# Patient Record
Sex: Female | Born: 1970 | Race: Black or African American | Hispanic: No | Marital: Single | Smoking: Never smoker
Health system: Southern US, Community
[De-identification: ages and names within clinical notes are randomized; demographics above are authoritative.]

## PROBLEM LIST (undated history)

## (undated) DIAGNOSIS — G43909 Migraine, unspecified, not intractable, without status migrainosus: Secondary | ICD-10-CM

## (undated) HISTORY — DX: Migraine, unspecified, not intractable, without status migrainosus: G43.909

---

## 2008-08-10 HISTORY — PX: CHOLECYSTECTOMY: SHX55

## 2009-02-17 ENCOUNTER — Emergency Department (HOSPITAL_COMMUNITY): Admission: EM | Admit: 2009-02-17 | Discharge: 2009-02-17 | Payer: Self-pay | Admitting: Emergency Medicine

## 2009-12-23 ENCOUNTER — Emergency Department (HOSPITAL_COMMUNITY): Admission: EM | Admit: 2009-12-23 | Discharge: 2009-12-23 | Payer: Self-pay | Admitting: Emergency Medicine

## 2010-01-12 ENCOUNTER — Emergency Department (HOSPITAL_COMMUNITY): Admission: EM | Admit: 2010-01-12 | Discharge: 2010-01-12 | Payer: Self-pay | Admitting: Emergency Medicine

## 2010-08-06 ENCOUNTER — Ambulatory Visit (HOSPITAL_COMMUNITY)
Admission: RE | Admit: 2010-08-06 | Discharge: 2010-08-06 | Payer: Self-pay | Source: Home / Self Care | Attending: Surgery | Admitting: Surgery

## 2010-10-20 LAB — COMPREHENSIVE METABOLIC PANEL
BUN: 8 mg/dL (ref 6–23)
CO2: 25 mEq/L (ref 19–32)
Chloride: 105 mEq/L (ref 96–112)
Creatinine, Ser: 0.63 mg/dL (ref 0.4–1.2)
GFR calc non Af Amer: >60 mL/min (ref >60)
Glucose, Bld: 82 mg/dL (ref 70–99)
Total Bilirubin: 0.5 mg/dL (ref 0.3–1.2)

## 2010-10-20 LAB — SURGICAL PCR SCREEN: Staphylococcus aureus: POSITIVE — AB

## 2010-10-20 LAB — CBC
HCT: 35 % — ABNORMAL LOW (ref 36.0–46.0)
Hemoglobin: 11.8 g/dL — ABNORMAL LOW (ref 12.0–15.0)
MCH: 30 pg (ref 26.0–34.0)
MCV: 89.1 fL (ref 78.0–100.0)
RBC: 3.93 MIL/uL (ref 3.87–5.11)

## 2010-10-20 LAB — DIFFERENTIAL
Basophils Absolute: 0 10*3/uL (ref 0.0–0.1)
Basophils Relative: 1 % (ref 0–1)
Lymphocytes Relative: 49 % — ABNORMAL HIGH (ref 12–46)
Neutro Abs: 1.5 10*3/uL — ABNORMAL LOW (ref 1.7–7.7)

## 2010-10-27 LAB — COMPREHENSIVE METABOLIC PANEL
Albumin: 4 g/dL (ref 3.5–5.2)
Alkaline Phosphatase: 74 U/L (ref 39–117)
BUN: 11 mg/dL (ref 6–23)
Calcium: 9.1 mg/dL (ref 8.4–10.5)
Creatinine, Ser: 0.66 mg/dL (ref 0.4–1.2)
Glucose, Bld: 94 mg/dL (ref 70–99)
Potassium: 4.1 mEq/L (ref 3.5–5.1)
Total Protein: 7.6 g/dL (ref 6.0–8.3)

## 2010-10-27 LAB — DIFFERENTIAL
Lymphocytes Relative: 34 % (ref 12–46)
Lymphs Abs: 1.2 10*3/uL (ref 0.7–4.0)
Monocytes Absolute: 0.3 10*3/uL (ref 0.1–1.0)
Monocytes Relative: 8 % (ref 3–12)
Neutro Abs: 2.1 10*3/uL (ref 1.7–7.7)
Neutrophils Relative %: 56 % (ref 43–77)

## 2010-10-27 LAB — URINALYSIS, ROUTINE W REFLEX MICROSCOPIC
Bilirubin Urine: NEGATIVE
Bilirubin Urine: NEGATIVE
Hgb urine dipstick: NEGATIVE
Ketones, ur: NEGATIVE mg/dL
Nitrite: NEGATIVE
Nitrite: NEGATIVE
Specific Gravity, Urine: 1.024 (ref 1.005–1.030)
Urobilinogen, UA: 0.2 mg/dL (ref 0.0–1.0)
Urobilinogen, UA: 1 mg/dL (ref 0.0–1.0)
pH: 8 (ref 5.0–8.0)
pH: 7.5 (ref 5.0–8.0)

## 2010-10-27 LAB — CBC
HCT: 32.7 % — ABNORMAL LOW (ref 36.0–46.0)
Hemoglobin: 11.1 g/dL — ABNORMAL LOW (ref 12.0–15.0)
MCHC: 34 g/dL (ref 30.0–36.0)
Platelets: 230 10*3/uL (ref 150–400)
RDW: 12.1 % (ref 11.5–15.5)

## 2010-10-27 LAB — GC/CHLAMYDIA PROBE AMP, GENITAL
Chlamydia, DNA Probe: NEGATIVE
GC Probe Amp, Genital: NEGATIVE

## 2010-10-27 LAB — URINE CULTURE: Colony Count: NO GROWTH

## 2010-10-27 LAB — GC/CHLAMYDIA PROBE AMP, URINE
Chlamydia, Swab/Urine, PCR: NEGATIVE
GC Probe Amp, Urine: NEGATIVE

## 2010-10-27 LAB — POCT PREGNANCY, URINE: Preg Test, Ur: NEGATIVE

## 2010-10-27 LAB — WET PREP, GENITAL
Trich, Wet Prep: NONE SEEN
Yeast Wet Prep HPF POC: NONE SEEN

## 2010-11-16 LAB — CBC
HCT: 33.6 % — ABNORMAL LOW (ref 36.0–46.0)
Hemoglobin: 11.1 g/dL — ABNORMAL LOW (ref 12.0–15.0)
MCHC: 33.2 g/dL (ref 30.0–36.0)
MCV: 90.4 fL (ref 78.0–100.0)
Platelets: 236 10*3/uL (ref 150–400)
RBC: 3.71 MIL/uL — ABNORMAL LOW (ref 3.87–5.11)
RDW: 13 % (ref 11.5–15.5)
WBC: 5.7 10*3/uL (ref 4.0–10.5)

## 2010-11-16 LAB — URINALYSIS, ROUTINE W REFLEX MICROSCOPIC
Glucose, UA: NEGATIVE mg/dL
Hgb urine dipstick: NEGATIVE
Ketones, ur: NEGATIVE mg/dL
Protein, ur: NEGATIVE mg/dL
Urobilinogen, UA: 1 mg/dL (ref 0.0–1.0)

## 2010-11-16 LAB — DIFFERENTIAL
Basophils Absolute: 0 10*3/uL (ref 0.0–0.1)
Basophils Relative: 0 % (ref 0–1)
Eosinophils Absolute: 0 10*3/uL (ref 0.0–0.7)
Neutrophils Relative %: 72 % (ref 43–77)

## 2010-11-16 LAB — LIPASE, BLOOD: Lipase: 28 U/L (ref 11–59)

## 2010-11-16 LAB — COMPREHENSIVE METABOLIC PANEL
Albumin: 4.1 g/dL (ref 3.5–5.2)
BUN: 13 mg/dL (ref 6–23)
Calcium: 9.1 mg/dL (ref 8.4–10.5)
Glucose, Bld: 111 mg/dL — ABNORMAL HIGH (ref 70–99)
Total Protein: 7.3 g/dL (ref 6.0–8.3)

## 2010-11-16 LAB — POCT PREGNANCY, URINE: Preg Test, Ur: NEGATIVE

## 2011-03-15 ENCOUNTER — Emergency Department (HOSPITAL_COMMUNITY)
Admission: EM | Admit: 2011-03-15 | Discharge: 2011-03-16 | Disposition: A | Discharge status: Home / Self Care | Payer: Self-pay | Attending: Emergency Medicine | Admitting: Emergency Medicine

## 2011-03-15 DIAGNOSIS — G43909 Migraine, unspecified, not intractable, without status migrainosus: Secondary | ICD-10-CM | POA: Insufficient documentation

## 2013-02-13 ENCOUNTER — Other Ambulatory Visit: Payer: Self-pay | Admitting: Family Medicine

## 2013-02-13 ENCOUNTER — Other Ambulatory Visit (HOSPITAL_COMMUNITY)
Admission: RE | Admit: 2013-02-13 | Discharge: 2013-02-13 | Disposition: A | Discharge status: Home / Self Care | Payer: BC Managed Care – PPO | Source: Ambulatory Visit | Attending: Family Medicine | Admitting: Family Medicine

## 2013-02-13 DIAGNOSIS — N6315 Unspecified lump in the right breast, overlapping quadrants: Secondary | ICD-10-CM

## 2013-02-13 DIAGNOSIS — Z124 Encounter for screening for malignant neoplasm of cervix: Secondary | ICD-10-CM | POA: Insufficient documentation

## 2013-02-13 DIAGNOSIS — Z1151 Encounter for screening for human papillomavirus (HPV): Secondary | ICD-10-CM | POA: Insufficient documentation

## 2013-02-23 ENCOUNTER — Ambulatory Visit
Admission: RE | Admit: 2013-02-23 | Discharge: 2013-02-23 | Disposition: A | Discharge status: Home / Self Care | Payer: BC Managed Care – PPO | Source: Ambulatory Visit | Attending: Family Medicine | Admitting: Family Medicine

## 2013-02-23 ENCOUNTER — Other Ambulatory Visit: Payer: Self-pay | Admitting: Family Medicine

## 2013-02-23 DIAGNOSIS — N6315 Unspecified lump in the right breast, overlapping quadrants: Secondary | ICD-10-CM

## 2013-07-19 ENCOUNTER — Ambulatory Visit (INDEPENDENT_AMBULATORY_CARE_PROVIDER_SITE_OTHER): Payer: BC Managed Care – PPO | Admitting: Gynecology

## 2013-07-19 ENCOUNTER — Other Ambulatory Visit: Payer: Self-pay | Admitting: Gynecology

## 2013-07-19 ENCOUNTER — Encounter: Payer: Self-pay | Admitting: Gynecology

## 2013-07-19 ENCOUNTER — Ambulatory Visit (INDEPENDENT_AMBULATORY_CARE_PROVIDER_SITE_OTHER): Payer: BC Managed Care – PPO

## 2013-07-19 ENCOUNTER — Other Ambulatory Visit (HOSPITAL_COMMUNITY)
Admission: RE | Admit: 2013-07-19 | Discharge: 2013-07-19 | Disposition: A | Discharge status: Home / Self Care | Payer: BC Managed Care – PPO | Source: Ambulatory Visit | Attending: Gynecology | Admitting: Gynecology

## 2013-07-19 VITALS — BP 126/72 | Ht 64.0 in | Wt 159.8 lb

## 2013-07-19 DIAGNOSIS — N949 Unspecified condition associated with female genital organs and menstrual cycle: Secondary | ICD-10-CM

## 2013-07-19 DIAGNOSIS — N923 Ovulation bleeding: Secondary | ICD-10-CM

## 2013-07-19 DIAGNOSIS — D259 Leiomyoma of uterus, unspecified: Secondary | ICD-10-CM

## 2013-07-19 DIAGNOSIS — N8300 Follicular cyst of ovary, unspecified side: Secondary | ICD-10-CM

## 2013-07-19 DIAGNOSIS — R102 Pelvic and perineal pain: Secondary | ICD-10-CM

## 2013-07-19 DIAGNOSIS — R87810 Cervical high risk human papillomavirus (HPV) DNA test positive: Secondary | ICD-10-CM | POA: Insufficient documentation

## 2013-07-19 DIAGNOSIS — R634 Abnormal weight loss: Secondary | ICD-10-CM

## 2013-07-19 DIAGNOSIS — N921 Excessive and frequent menstruation with irregular cycle: Secondary | ICD-10-CM

## 2013-07-19 DIAGNOSIS — D252 Subserosal leiomyoma of uterus: Secondary | ICD-10-CM | POA: Insufficient documentation

## 2013-07-19 DIAGNOSIS — Z1151 Encounter for screening for human papillomavirus (HPV): Secondary | ICD-10-CM | POA: Insufficient documentation

## 2013-07-19 DIAGNOSIS — Z01419 Encounter for gynecological examination (general) (routine) without abnormal findings: Secondary | ICD-10-CM | POA: Insufficient documentation

## 2013-07-19 DIAGNOSIS — N920 Excessive and frequent menstruation with regular cycle: Secondary | ICD-10-CM

## 2013-07-19 DIAGNOSIS — Z124 Encounter for screening for malignant neoplasm of cervix: Secondary | ICD-10-CM

## 2013-07-19 LAB — TSH: TSH: 1.18 u[IU]/mL (ref 0.350–4.500)

## 2013-07-19 NOTE — Patient Instructions (Signed)

## 2013-07-19 NOTE — Progress Notes (Signed)
   The patient is a 42 year old gravida 1 para 1 new patient to the practice who states that she has been receiving her medical exam through her primary care physician. Patient's states that for the past month she had been complaining of on and off left lower quadrant discomfort and which he told her primary care physician in state that they wanted any further evaluation at that time and that's why she is here today. Her PCP has been doing her lab work. Not certain if he has done her Pap smear. Patient is not sexually active for the past 5 years. She states that her cycles have been normal until November where she had 2 cycles lasting about 3-4 days and very light. She states that over the past year she has lost 38 pounds and she is discussed this with her PCP. She denies any visual disturbances, nipple discharge or unusual headaches. She states that her mammograms are up-to-date. She denies any past history of abnormal Pap smears.  Exam: General appearance: Patient does not appear to be any acute distress Abdomen: Soft nontender no rebound or guarding Back: No CVA tenderness Pelvic: Bartholin urethra Skene was within normal limits Vagina: No lesions or discharge Cervix: No lesions or discharge Uterus anteverted normal size shape and consistency Adnexa: No palpable mass or tenderness rectal exam: Not done  An ultrasound today was done with a fall and result: Uterus measured 8.2 x 5.5 x 4.4 cm with an endometrial stripe of 7 mm. A right pedunculated fibroid measured 18 x 15 mm were noted extending from the right fundus with a feeder vessel. Right left ovary were otherwise normal. No fluid in the cul-de-sac.  Assessment/plan: 42 year old patient with isolated event of intermenstrual spotting one time and on and off left lower quadrant discomfort but has resolved. Ultrasound with a very small pedunculated fibroid measured 18 x 15 mm. I believe that we can watch for now and her symptoms get worse we'll  reassess it may be recommend laparoscopy if indicated. We'll also test her TSH and prolactin today as well as a hemoglobin A1c and notify her if there is any abnormality of these tests. She should further discuss her significant weight loss with her PCP for additional testing if indicated. We'll monitor her cycles for the next few months and she has any further similar episodes of intermenstrual bleeding we'll we'll proceed with an endometrial biopsy and sonohysterogram. A Pap smear was done today.

## 2014-03-12 ENCOUNTER — Ambulatory Visit
Admission: RE | Admit: 2014-03-12 | Discharge: 2014-03-12 | Disposition: A | Discharge status: Home / Self Care | Payer: BC Managed Care – PPO | Source: Ambulatory Visit | Attending: Internal Medicine | Admitting: Internal Medicine

## 2014-03-12 ENCOUNTER — Other Ambulatory Visit: Payer: Self-pay | Admitting: Internal Medicine

## 2014-03-12 DIAGNOSIS — M549 Dorsalgia, unspecified: Secondary | ICD-10-CM

## 2014-05-04 ENCOUNTER — Other Ambulatory Visit (INDEPENDENT_AMBULATORY_CARE_PROVIDER_SITE_OTHER): Payer: Self-pay | Admitting: General Surgery

## 2014-05-04 DIAGNOSIS — N6320 Unspecified lump in the left breast, unspecified quadrant: Secondary | ICD-10-CM

## 2014-05-10 ENCOUNTER — Other Ambulatory Visit (INDEPENDENT_AMBULATORY_CARE_PROVIDER_SITE_OTHER): Payer: Self-pay | Admitting: General Surgery

## 2014-05-10 ENCOUNTER — Ambulatory Visit
Admission: RE | Admit: 2014-05-10 | Discharge: 2014-05-10 | Disposition: A | Discharge status: Home / Self Care | Payer: BC Managed Care – PPO | Source: Ambulatory Visit | Attending: General Surgery | Admitting: General Surgery

## 2014-05-10 DIAGNOSIS — N6320 Unspecified lump in the left breast, unspecified quadrant: Secondary | ICD-10-CM

## 2014-05-15 ENCOUNTER — Ambulatory Visit
Admission: RE | Admit: 2014-05-15 | Discharge: 2014-05-15 | Disposition: A | Discharge status: Home / Self Care | Payer: BC Managed Care – PPO | Source: Ambulatory Visit | Attending: General Surgery | Admitting: General Surgery

## 2014-05-15 DIAGNOSIS — N6320 Unspecified lump in the left breast, unspecified quadrant: Secondary | ICD-10-CM

## 2014-05-15 HISTORY — PX: BREAST BIOPSY: SHX20

## 2014-06-11 ENCOUNTER — Encounter: Payer: Self-pay | Admitting: Gynecology

## 2014-12-06 ENCOUNTER — Other Ambulatory Visit: Payer: Self-pay | Admitting: General Surgery

## 2014-12-06 DIAGNOSIS — R921 Mammographic calcification found on diagnostic imaging of breast: Secondary | ICD-10-CM

## 2014-12-10 ENCOUNTER — Other Ambulatory Visit: Payer: Self-pay | Admitting: Internal Medicine

## 2014-12-10 ENCOUNTER — Other Ambulatory Visit: Payer: Self-pay

## 2014-12-10 DIAGNOSIS — E0789 Other specified disorders of thyroid: Secondary | ICD-10-CM

## 2014-12-11 ENCOUNTER — Ambulatory Visit
Admission: RE | Admit: 2014-12-11 | Discharge: 2014-12-11 | Disposition: A | Discharge status: Home / Self Care | Payer: 59 | Source: Ambulatory Visit | Attending: General Surgery | Admitting: General Surgery

## 2014-12-11 DIAGNOSIS — R921 Mammographic calcification found on diagnostic imaging of breast: Secondary | ICD-10-CM

## 2014-12-17 ENCOUNTER — Ambulatory Visit
Admission: RE | Admit: 2014-12-17 | Discharge: 2014-12-17 | Disposition: A | Discharge status: Home / Self Care | Payer: 59 | Source: Ambulatory Visit | Attending: Internal Medicine | Admitting: Internal Medicine

## 2014-12-17 DIAGNOSIS — E0789 Other specified disorders of thyroid: Secondary | ICD-10-CM

## 2015-11-18 ENCOUNTER — Other Ambulatory Visit: Payer: Self-pay

## 2015-11-18 DIAGNOSIS — Z1231 Encounter for screening mammogram for malignant neoplasm of breast: Secondary | ICD-10-CM

## 2015-12-10 ENCOUNTER — Ambulatory Visit
Admission: RE | Admit: 2015-12-10 | Discharge: 2015-12-10 | Disposition: A | Discharge status: Home / Self Care | Payer: BLUE CROSS/BLUE SHIELD | Source: Ambulatory Visit

## 2015-12-10 DIAGNOSIS — Z1231 Encounter for screening mammogram for malignant neoplasm of breast: Secondary | ICD-10-CM

## 2015-12-27 ENCOUNTER — Other Ambulatory Visit (HOSPITAL_COMMUNITY)
Admission: RE | Admit: 2015-12-27 | Discharge: 2015-12-27 | Disposition: A | Discharge status: Home / Self Care | Payer: BLUE CROSS/BLUE SHIELD | Source: Ambulatory Visit | Attending: Obstetrics and Gynecology | Admitting: Obstetrics and Gynecology

## 2015-12-27 ENCOUNTER — Other Ambulatory Visit: Payer: Self-pay | Admitting: Obstetrics & Gynecology

## 2015-12-27 DIAGNOSIS — Z01419 Encounter for gynecological examination (general) (routine) without abnormal findings: Secondary | ICD-10-CM | POA: Diagnosis not present

## 2015-12-31 LAB — CYTOLOGY - PAP

## 2016-10-26 ENCOUNTER — Ambulatory Visit
Admission: RE | Admit: 2016-10-26 | Discharge: 2016-10-26 | Disposition: A | Discharge status: Home / Self Care | Payer: BLUE CROSS/BLUE SHIELD | Source: Ambulatory Visit | Attending: Internal Medicine | Admitting: Internal Medicine

## 2016-10-26 ENCOUNTER — Other Ambulatory Visit: Payer: Self-pay | Admitting: Internal Medicine

## 2016-10-26 DIAGNOSIS — R7611 Nonspecific reaction to tuberculin skin test without active tuberculosis: Secondary | ICD-10-CM

## 2016-11-16 ENCOUNTER — Other Ambulatory Visit: Payer: Self-pay | Admitting: Obstetrics and Gynecology

## 2016-11-16 DIAGNOSIS — Z1231 Encounter for screening mammogram for malignant neoplasm of breast: Secondary | ICD-10-CM

## 2016-12-14 ENCOUNTER — Ambulatory Visit
Admission: RE | Admit: 2016-12-14 | Discharge: 2016-12-14 | Disposition: A | Discharge status: Home / Self Care | Payer: BLUE CROSS/BLUE SHIELD | Source: Ambulatory Visit | Attending: Obstetrics and Gynecology | Admitting: Obstetrics and Gynecology

## 2016-12-14 DIAGNOSIS — Z1231 Encounter for screening mammogram for malignant neoplasm of breast: Secondary | ICD-10-CM

## 2016-12-23 ENCOUNTER — Encounter: Payer: Self-pay | Admitting: Gynecology

## 2018-01-17 ENCOUNTER — Other Ambulatory Visit: Payer: Self-pay

## 2018-01-17 ENCOUNTER — Ambulatory Visit (HOSPITAL_COMMUNITY)
Admission: EM | Admit: 2018-01-17 | Discharge: 2018-01-17 | Disposition: A | Discharge status: Home / Self Care | Payer: BLUE CROSS/BLUE SHIELD | Attending: Family Medicine | Admitting: Family Medicine

## 2018-01-17 ENCOUNTER — Encounter (HOSPITAL_COMMUNITY): Payer: Self-pay | Admitting: Emergency Medicine

## 2018-01-17 DIAGNOSIS — Z8249 Family history of ischemic heart disease and other diseases of the circulatory system: Secondary | ICD-10-CM | POA: Insufficient documentation

## 2018-01-17 DIAGNOSIS — G43909 Migraine, unspecified, not intractable, without status migrainosus: Secondary | ICD-10-CM | POA: Diagnosis not present

## 2018-01-17 DIAGNOSIS — R1032 Left lower quadrant pain: Secondary | ICD-10-CM | POA: Diagnosis not present

## 2018-01-17 DIAGNOSIS — Z3202 Encounter for pregnancy test, result negative: Secondary | ICD-10-CM | POA: Diagnosis not present

## 2018-01-17 DIAGNOSIS — Z9049 Acquired absence of other specified parts of digestive tract: Secondary | ICD-10-CM | POA: Diagnosis not present

## 2018-01-17 DIAGNOSIS — M545 Low back pain: Secondary | ICD-10-CM | POA: Diagnosis not present

## 2018-01-17 DIAGNOSIS — K5900 Constipation, unspecified: Secondary | ICD-10-CM | POA: Insufficient documentation

## 2018-01-17 DIAGNOSIS — M549 Dorsalgia, unspecified: Secondary | ICD-10-CM | POA: Diagnosis present

## 2018-01-17 DIAGNOSIS — D252 Subserosal leiomyoma of uterus: Secondary | ICD-10-CM | POA: Insufficient documentation

## 2018-01-17 DIAGNOSIS — G8929 Other chronic pain: Secondary | ICD-10-CM | POA: Insufficient documentation

## 2018-01-17 DIAGNOSIS — R109 Unspecified abdominal pain: Secondary | ICD-10-CM | POA: Diagnosis present

## 2018-01-17 LAB — COMPREHENSIVE METABOLIC PANEL
ALK PHOS: 61 U/L (ref 38–126)
ALT: 16 U/L (ref 14–54)
AST: 20 U/L (ref 15–41)
Albumin: 3.9 g/dL (ref 3.5–5.0)
Anion gap: 8 (ref 5–15)
BILIRUBIN TOTAL: 0.4 mg/dL (ref 0.3–1.2)
BUN: 11 mg/dL (ref 6–20)
CALCIUM: 9.5 mg/dL (ref 8.9–10.3)
CO2: 23 mmol/L (ref 22–32)
CREATININE: 0.67 mg/dL (ref 0.44–1.00)
Chloride: 107 mmol/L (ref 101–111)
Glucose, Bld: 93 mg/dL (ref 65–99)
Potassium: 4.5 mmol/L (ref 3.5–5.1)
Sodium: 138 mmol/L (ref 135–145)
TOTAL PROTEIN: 7.4 g/dL (ref 6.5–8.1)

## 2018-01-17 LAB — CBC
HCT: 38.1 % (ref 36.0–46.0)
Hemoglobin: 12.3 g/dL (ref 12.0–15.0)
MCH: 29.4 pg (ref 26.0–34.0)
MCHC: 32.3 g/dL (ref 30.0–36.0)
MCV: 90.9 fL (ref 78.0–100.0)
PLATELETS: 224 10*3/uL (ref 150–400)
RBC: 4.19 MIL/uL (ref 3.87–5.11)
RDW: 11.9 % (ref 11.5–15.5)
WBC: 2.9 10*3/uL — AB (ref 4.0–10.5)

## 2018-01-17 LAB — POCT URINALYSIS DIP (DEVICE)
Bilirubin Urine: NEGATIVE
Glucose, UA: NEGATIVE mg/dL
KETONES UR: NEGATIVE mg/dL
Leukocytes, UA: NEGATIVE
Nitrite: NEGATIVE
PH: 7 (ref 5.0–8.0)
PROTEIN: NEGATIVE mg/dL
SPECIFIC GRAVITY, URINE: 1.015 (ref 1.005–1.030)
Urobilinogen, UA: 0.2 mg/dL (ref 0.0–1.0)

## 2018-01-17 LAB — POCT PREGNANCY, URINE: Preg Test, Ur: NEGATIVE

## 2018-01-17 MED ORDER — KETOROLAC TROMETHAMINE 60 MG/2ML IM SOLN
60.0000 mg | Freq: Once | INTRAMUSCULAR | Status: AC
  Administered 2018-01-17: 60 mg via INTRAMUSCULAR

## 2018-01-17 MED ORDER — KETOROLAC TROMETHAMINE 60 MG/2ML IM SOLN
INTRAMUSCULAR | Status: AC
  Filled 2018-01-17: qty 2

## 2018-01-17 MED ORDER — NAPROXEN 500 MG PO TABS: 500.0000 mg | ORAL_TABLET | Freq: Two times a day (BID) | ORAL | 0 refills | Status: DC

## 2018-01-17 MED ORDER — PREDNISONE 50 MG PO TABS: 50.0000 mg | ORAL_TABLET | Freq: Every day | ORAL | 0 refills | Status: AC

## 2018-01-17 NOTE — ED Triage Notes (Signed)
Onset 3 months ago, reports abdominal and back pain that is worse the last few days.   Patient has radiology reports with her.

## 2018-01-17 NOTE — ED Provider Notes (Signed)
[facility]    CSN: [id number] Arrival date & time: 01/17/18  1002     History   Chief Complaint Chief Complaint  Patient presents with  . Back Pain    HPI Bethany Sanders is a 47 y.o. female history of fibroids, previous cholecystectomy presenting today for evaluation of back pain and abdominal pain.  Patient states that she has had back pain for the past 3 months, pain has worsened over the past 4 to 5 days.  She is previously had an MRI of the back showing at L5/S1 disc desiccation and protrusion/compression.  She has plans to have a spinal injection on 6/24.  She is hoping to have pain relief until this time.  She denies any saddle anesthesia.  She has noted that she has had increased urgency and increased frequency, but denies incontinence or retention of urine.  She also notes that she has had harder bowels recently.  Has been drinking prune juice.  Last bowel movement was yesterday.  Denies diarrhea.  She has tried taking Flexeril without relief, has tried Aleve with some relief.  Patient also having left lower quadrant abdominal pain for the past 10 days.  She notes that her constipation has been going on for approximately the same amount of time.  She has a sore sensation throughout her abdomen, but will have sharp stabbing pain in her left lower quadrant occasionally.  Initially she states that the pain worsens with pain, later on she denies this and states that it worsens with movement and often feels this when she has sharp pains in her back.  Patient is still getting her menstrual cycle, last cycle was 10 days ago.  Denies any abnormal bleeding or discharge.  Denies history of cyst.  Denies history of kidney stones.  HPI  Past Medical History:  Diagnosis Date  . Migraine     Patient Active Problem List   Diagnosis Date Noted  . Intermenstrual bleeding 07/19/2013  . Fibroids, subserous 07/19/2013    Past Surgical History:  Procedure Laterality Date  .  BREAST BIOPSY Left 05/15/2014  . CHOLECYSTECTOMY      OB History    Gravida  1   Para  1   Term      Preterm      AB      Living  1     SAB      TAB      Ectopic      Multiple      Live Births               Home Medications    Prior to Admission medications   Medication Sig Start Date End Date Taking? Authorizing Provider  naproxen sodium (ALEVE) 220 MG tablet Take 220 mg by mouth.   Yes [provider]  naproxen (NAPROSYN) 500 MG tablet Take 1 tablet (500 mg total) by mouth 2 (two) times daily. 01/17/18   Sascha Palma C, PA-C  predniSONE (DELTASONE) 50 MG tablet Take 1 tablet (50 mg total) by mouth daily for 5 days. 01/17/18 01/22/18  Maddyn Lieurance C, PA-C  SUMAtriptan (IMITREX) 50 MG tablet Take 50 mg by mouth every 2 (two) hours as needed for migraine or headache. May repeat in 2 hours if headache persists or recurs.    [provider]    Family History Family History  Problem Relation Age of Onset  . Hypertension Father   . Diabetes Father     Social  History Social History   Tobacco Use  . Smoking status: Never Smoker  . Smokeless tobacco: Never Used  Substance Use Topics  . Alcohol use: No  . Drug use: No     Allergies   Patient has no known allergies.   Review of Systems Review of Systems  Constitutional: Negative for fever.  Eyes: Negative for visual disturbance.  Respiratory: Positive for shortness of breath. Negative for cough.   Cardiovascular: Negative for chest pain.  Gastrointestinal: Positive for abdominal pain and constipation. Negative for diarrhea, nausea and vomiting.  Genitourinary: Positive for frequency. Negative for dysuria, flank pain, genital sores, hematuria, menstrual problem, vaginal bleeding, vaginal discharge and vaginal pain.  Musculoskeletal: Positive for back pain, gait problem and myalgias. Negative for arthralgias, neck pain and neck stiffness.  Skin: Negative for rash.  Neurological:  Negative for dizziness, weakness, light-headedness, numbness and headaches.     Physical Exam Triage Vital Signs ED Triage Vitals  Enc Vitals Group     BP 01/17/18 1015 131/84     Pulse Rate 01/17/18 1015 85     Resp 01/17/18 1015 20     Temp 01/17/18 1015 98.3 F (36.8 C)     Temp Source 01/17/18 1015 Oral     SpO2 01/17/18 1015 100 %     Weight --      Height --      Head Circumference --      Peak Flow --      Pain Score 01/17/18 1012 10     Pain Loc --      Pain Edu? --      Excl. in [place]? --    No data found.  Updated Vital Signs BP 131/84 (BP Location: Left Arm)   Pulse 85   Temp 98.3 F (36.8 C) (Oral)   Resp 20   LMP 01/07/2018   SpO2 100%   Visual Acuity Right Eye Distance:   Left Eye Distance:   Bilateral Distance:    Right Eye Near:   Left Eye Near:    Bilateral Near:     Physical Exam  Constitutional: She appears well-developed and well-nourished. No distress.  HENT:  Head: Normocephalic and atraumatic.  Mouth/Throat: Oropharynx is clear and moist.  Eyes: Pupils are equal, round, and reactive to light. Conjunctivae and EOM are normal.  Neck: Neck supple.  Cardiovascular: Normal rate and regular rhythm.  No murmur heard. Pulmonary/Chest: Effort normal and breath sounds normal. No respiratory distress.  Breathing comfortably at rest, CTABL, no wheezing, rales or other adventitious sounds auscultated  Abdominal: Soft. There is tenderness.  Abdomen soft, nondistended, mild tenderness diffusely throughout abdomen, negative rebound, negative Rovsing, negative McBurney's.  No bulging or hernia palpated with coughing.  Musculoskeletal: She exhibits no edema.  Midline tenderness to lower lumbar and upper sacral area, mild tenderness to paraspinal musculature, nontender to lateral lumbar musculature.  Negative straight leg raise bilaterally, does not radiate down leg, but does worsen pain in back.  Gait slow, but without abnormality  Neurological: She is  alert.  Skin: Skin is warm and dry.  Psychiatric: She has a normal mood and affect.  Nursing note and vitals reviewed.    UC Treatments / Results  Labs (all labs ordered are listed, but only abnormal results are displayed) Labs Reviewed  POCT URINALYSIS DIP (DEVICE) - Abnormal; Notable for the following components:      Result Value   Hgb urine dipstick TRACE (*)    All other components within  normal limits  COMPREHENSIVE METABOLIC PANEL  CBC  POCT PREGNANCY, URINE    EKG None  Radiology No results found.  Procedures Procedures (including critical care time)  Medications Ordered in UC Medications  ketorolac (TORADOL) injection 60 mg (has no administration in time range)    Initial Impression / Assessment and Plan / UC Course  I have reviewed the triage vital signs and the nursing notes.  Pertinent labs & imaging results that were available during my care of the patient were reviewed by me and considered in my medical decision making (see chart for details).  Clinical Course as of Jan 18 1104  Mon Jan 17, 2018  1043 Bilirubin Urine: NEGATIVE [HW]    Clinical Course User Index [HW] Dontai Pember C, PA-C    Back pain likely acute on chronic given associated degenerative changes.  Will provide injection of Toradol today, will send home with short course of prednisone as well as Naprosyn for further pain management.  Follow-up on the 24th for injection.  Abdominal pain possibly related to back pain with muscular etiology versus related to constipation versus pelvic origin.  Abdominal exam negative for peritoneal signs, does not appear to be acute abdomen.  UA negative for signs of infection, trace hemoglobin.  Discussed lifestyle changes/modifications to help with constipation, also discussed using MiraLAX to help with constipation.  Will draw CBC and CMP to check basic labs for abnormalities.  Otherwise follow-up with PCP, return here or go to emergency room if pain  worsening not improving.  Discussed strict return precautions. Patient verbalized understanding and is agreeable with plan.  Final Clinical Impressions(s) / UC Diagnoses   Final diagnoses:  Chronic midline low back pain without sciatica  Left lower quadrant pain     Discharge Instructions     Back Pain: We gave you a shot of Toradol today, please use naprosyn and prednisone as prescribed. Follow up with orthopedics.  Please go to emergency room if back pain worsening and developing loss of bowel or bladder control or numbness between your thighs.  Abdominal Pain: Your urine did not show any signs of infection, we drew some blood to check some basic labs as causes of your abdominal pain.  We will call you if these are normal.  Abdominal pain may be related to constipation or possibly pelvic origin.  Please drink 8 to 10 glasses of water daily, please increase fiber intake.   Please follow-up with your PCP if symptoms persisting, please go to emergency room if abdominal pain worsening, developing nausea or vomiting,   ED Prescriptions    Medication Sig Dispense Auth. Provider   naproxen (NAPROSYN) 500 MG tablet Take 1 tablet (500 mg total) by mouth 2 (two) times daily. 30 tablet Jaxn Chiquito C, PA-C   predniSONE (DELTASONE) 50 MG tablet Take 1 tablet (50 mg total) by mouth daily for 5 days. 5 tablet Mandel Seiden C, PA-C     Controlled Substance Prescriptions [place] Controlled Substance Registry consulted? Not Applicable   Janith Lima, [place] 01/17/18 1130

## 2018-01-17 NOTE — Discharge Instructions (Addendum)
Back Pain: We gave you a shot of Toradol today, please use naprosyn and prednisone as prescribed. Follow up with orthopedics.  Please go to emergency room if back pain worsening and developing loss of bowel or bladder control or numbness between your thighs.  Abdominal Pain: Your urine did not show any signs of infection, we drew some blood to check some basic labs as causes of your abdominal pain.  We will call you if these are normal.  Abdominal pain may be related to constipation or possibly pelvic origin.  Please drink 8 to 10 glasses of water daily, please increase fiber intake.   Please follow-up with your PCP if symptoms persisting, please go to emergency room if abdominal pain worsening, developing nausea or vomiting,

## 2018-01-24 ENCOUNTER — Encounter (HOSPITAL_COMMUNITY): Payer: Self-pay | Admitting: *Deleted

## 2018-01-24 ENCOUNTER — Emergency Department (HOSPITAL_COMMUNITY)
Admission: EM | Admit: 2018-01-24 | Discharge: 2018-01-24 | Disposition: A | Discharge status: Home / Self Care | Payer: BLUE CROSS/BLUE SHIELD | Attending: Emergency Medicine | Admitting: Emergency Medicine

## 2018-01-24 ENCOUNTER — Other Ambulatory Visit: Payer: Self-pay

## 2018-01-24 ENCOUNTER — Emergency Department (HOSPITAL_BASED_OUTPATIENT_CLINIC_OR_DEPARTMENT_OTHER): Payer: BLUE CROSS/BLUE SHIELD

## 2018-01-24 DIAGNOSIS — M79605 Pain in left leg: Secondary | ICD-10-CM | POA: Diagnosis present

## 2018-01-24 DIAGNOSIS — M79609 Pain in unspecified limb: Secondary | ICD-10-CM | POA: Diagnosis not present

## 2018-01-24 DIAGNOSIS — Z79899 Other long term (current) drug therapy: Secondary | ICD-10-CM | POA: Insufficient documentation

## 2018-01-24 DIAGNOSIS — M5432 Sciatica, left side: Secondary | ICD-10-CM | POA: Insufficient documentation

## 2018-01-24 MED ORDER — CYCLOBENZAPRINE HCL 10 MG PO TABS: 10.0000 mg | ORAL_TABLET | Freq: Two times a day (BID) | ORAL | 0 refills | Status: DC | PRN

## 2018-01-24 MED ORDER — PREDNISONE 10 MG (21) PO TBPK: ORAL_TABLET | Freq: Every day | ORAL | 0 refills | Status: DC

## 2018-01-24 NOTE — Progress Notes (Signed)
VASCULAR LAB PRELIMINARY  PRELIMINARY  PRELIMINARY  PRELIMINARY  Left lower extremity venous duplex completed.    Preliminary report:  There is no DVT or SVT noted in the left lower extremity.  Sanah Kraska, RVT 01/24/2018, 6:46 PM

## 2018-01-24 NOTE — ED Notes (Signed)
Pt ambulated to room from waiting room. Some stiffness noted to left leg when walking. Pt endorses pain to the back of the upper leg. Denies injury.

## 2018-01-24 NOTE — ED Provider Notes (Signed)
[facility]PART[facility] Provider Note   CSN: [id number] Arrival date & time: 01/24/18  1504     History   Chief Complaint Chief Complaint  Patient presents with  . Back Pain  . Leg Pain    HPI Cimberly T Son is a 47 y.o. female who presents to ED for evaluation of 3 day history of worsening L leg pain. States that the pain begins in her left lower back and radiates down her leg. She feels like the pain is worse with sitting and sleeping. She feels that her legs are swollen. Her father had similar pain before he was diagnosed with a blood clot. She is followed by ortho for her "slipped discs" in her back. She has been taking naproxen for back pain but it is not helping with her leg. She denies any prior leg or back surgeries, recent surgeries, recent prolonged travel, OCP use, numbness in legs, prior DVT/PE, erythema or site, injuries or falls.  HPI  Past Medical History:  Diagnosis Date  . Migraine     Patient Active Problem List   Diagnosis Date Noted  . Intermenstrual bleeding 07/19/2013  . Fibroids, subserous 07/19/2013    Past Surgical History:  Procedure Laterality Date  . BREAST BIOPSY Left 05/15/2014  . CHOLECYSTECTOMY       OB History    Gravida  1   Para  1   Term      Preterm      AB      Living  1     SAB      TAB      Ectopic      Multiple      Live Births               Home Medications    Prior to Admission medications   Medication Sig Start Date End Date Taking? Authorizing Provider  cyclobenzaprine (FLEXERIL) 10 MG tablet Take 1 tablet (10 mg total) by mouth 2 (two) times daily as needed for muscle spasms. 01/24/18   Mayleigh Tetrault, PA-C  naproxen (NAPROSYN) 500 MG tablet Take 1 tablet (500 mg total) by mouth 2 (two) times daily. 01/17/18   Wieters, Hallie C, PA-C  naproxen sodium (ALEVE) 220 MG tablet Take 220 mg by mouth.    [provider]  predniSONE (STERAPRED UNI-PAK 21 TAB) 10 MG (21)  TBPK tablet Take by mouth daily. Take 6 tabs by mouth daily  for 2 days, then 5 tabs for 2 days, then 4 tabs for 2 days, then 3 tabs for 2 days, 2 tabs for 2 days, then 1 tab by mouth daily for 2 days 01/24/18   Delia Heady, PA-C  SUMAtriptan (IMITREX) 50 MG tablet Take 50 mg by mouth every 2 (two) hours as needed for migraine or headache. May repeat in 2 hours if headache persists or recurs.    [provider]    Family History Family History  Problem Relation Age of Onset  . Hypertension Father   . Diabetes Father     Social History Social History   Tobacco Use  . Smoking status: Never Smoker  . Smokeless tobacco: Never Used  Substance Use Topics  . Alcohol use: No  . Drug use: No     Allergies   Patient has no known allergies.   Review of Systems Review of Systems  Constitutional: Negative for chills and fever.  Respiratory: Negative for shortness of breath.   Cardiovascular: Negative  for chest pain.  Musculoskeletal: Positive for back pain and myalgias. Negative for gait problem, joint swelling and neck stiffness.  Skin: Negative for color change and wound.  Neurological: Negative for weakness and numbness.     Physical Exam Updated Vital Signs BP 135/84 (BP Location: Right Arm)   Pulse 84   Temp 98.5 F (36.9 C) (Oral)   Resp 20   LMP 01/07/2018   SpO2 100%   Physical Exam  Constitutional: She appears well-developed and well-nourished. No distress.  HENT:  Head: Normocephalic and atraumatic.  Eyes: Conjunctivae and EOM are normal. No scleral icterus.  Neck: Normal range of motion.  Pulmonary/Chest: Effort normal. No respiratory distress.  Musculoskeletal:  Posterior LLE TTP diffusely. No edema of lower extremity, no warm of area noted. No midline spinal tenderness present in lumbar, thoracic or cervical spine. No step-off palpated. No visible bruising, edema or temperature change noted. No objective signs of numbness present. No saddle anesthesia.  2+ DP pulses bilaterally. Sensation intact to light touch. Strength 5/5 in bilateral lower extremities.  Neurological: She is alert.  Skin: No rash noted. She is not diaphoretic.  Psychiatric: She has a normal mood and affect.  Nursing note and vitals reviewed.    ED Treatments / Results  Labs (all labs ordered are listed, but only abnormal results are displayed) Labs Reviewed - No data to display  EKG None  Radiology No results found.   VASCULAR LAB PRELIMINARY  PRELIMINARY  PRELIMINARY  PRELIMINARY  Left lower extremity venous duplex completed.    Preliminary report:  There is no DVT or SVT noted in the left lower extremity.  KANADY, CANDACE, RVT 01/24/2018, 6:46 PM    Procedures Procedures (including critical care time)  Medications Ordered in ED Medications - No data to display   Initial Impression / Assessment and Plan / ED Course  I have reviewed the triage vital signs and the nursing notes.  Pertinent labs & imaging results that were available during my care of the patient were reviewed by me and considered in my medical decision making (see chart for details).     Patient presents to ED for evaluation of left lower back pain radiating down the leg.  Symptoms have been going for several weeks but have worsened in the past 3 days.  Denies any injuries or falls.  She is concerned that she may have a blood clot.  Her dad has blood clots.  DVT study returned as negative. Patient denies any concerning symptoms suggestive of cauda equina requiring urgent imaging at this time such as loss of sensation in the lower extremities, lower extremity weakness, loss of bowel or bladder control, saddle anesthesia, urinary retention, fever/chills, IVDU. Exam demonstrated no  weakness on exam today. No preceding injury or trauma to suggest acute fracture. Doubt pelvic or urinary pathology for patient's acute back pain, no history/pain not consistent with nephrolithiasis. Doubt AAA  as cause of patient's back pain as patient lacks major risk factors, had no abdominal TTP, and has symmetric and intact distal pulses. Patient given strict return precautions for any symptoms indicating worsening neurologic function in the lower extremities.  Portions of this note were generated with [employer]. Dictation errors may occur despite best attempts at proofreading.   Final Clinical Impressions(s) / ED Diagnoses   Final diagnoses:  Sciatica of left side    ED Discharge Orders        Ordered    predniSONE (STERAPRED UNI-PAK 21 TAB) 10 MG (  21) TBPK tablet  Daily     01/24/18 1840    cyclobenzaprine (FLEXERIL) 10 MG tablet  2 times daily PRN     01/24/18 1840       Delia Heady, PA-C 01/24/18 1849    Valarie Merino, MD 01/24/18 2359

## 2018-01-24 NOTE — ED Triage Notes (Signed)
Pt in c/o left leg pain, states she has some slipped disks in her back and is scheduled to get injections into her back next week, pain starts in left buttocks and radiates down, ambulatory to room

## 2018-01-24 NOTE — ED Notes (Signed)
Vascular at bedside

## 2018-03-14 ENCOUNTER — Encounter: Payer: Self-pay | Admitting: Family Medicine

## 2018-03-14 ENCOUNTER — Ambulatory Visit: Payer: BLUE CROSS/BLUE SHIELD | Admitting: Family Medicine

## 2018-03-14 VITALS — BP 100/80 | HR 95 | Temp 98.7°F | Ht 65.0 in | Wt 186.6 lb

## 2018-03-14 DIAGNOSIS — Z0000 Encounter for general adult medical examination without abnormal findings: Secondary | ICD-10-CM | POA: Diagnosis not present

## 2018-03-14 NOTE — Progress Notes (Signed)
Subjective:  Patient ID: Bethany Sanders, female    DOB: 1971-03-21  Age: 47 y.o. MRN: [id number]  CC: Establish Care (pt wants to establish care)   HPI [name] presents for establishment of care and initial physical evaluation.  She lives with her husband in 64 year old daughter.  She works as a Building control surveyor.  There is heavy lifting involved.  She has been having lower back issues and is currently seeing orthopedics for this.  Surgery has been recommended that she is working on losing weight and doing the given exercises.  Her back is slowly and improving.  She is planning on finding another line of work.  She does have a history of migraines that have been treated successfully in the past with Imitrex.  Her father is 73 and in relatively good health.  He did come to stay with her but then decided to go back home to [place].  Her mother died a few years ago after some type of renal surgery.  Patient does not smoke drink alcohol use illicit drugs.  History Adaora has a past medical history of Migraine.   She has a past surgical history that includes Cholecystectomy and Breast biopsy (Left, 05/15/2014).   Her family history includes Diabetes in her father; Hypertension in her father.She reports that she has never smoked. She has never used smokeless tobacco. She reports that she does not drink alcohol or use drugs.  Outpatient Medications Prior to Visit  Medication Sig Dispense Refill  . cyclobenzaprine (FLEXERIL) 10 MG tablet Take 1 tablet (10 mg total) by mouth 2 (two) times daily as needed for muscle spasms. (Patient not taking: Reported on 03/14/2018) 20 tablet 0  . naproxen (NAPROSYN) 500 MG tablet Take 1 tablet (500 mg total) by mouth 2 (two) times daily. (Patient not taking: Reported on 03/14/2018) 30 tablet 0  . naproxen sodium (ALEVE) 220 MG tablet Take 220 mg by mouth.    . predniSONE (STERAPRED UNI-PAK 21 TAB) 10 MG (21) TBPK tablet Take by mouth daily. Take 6 tabs by mouth  daily  for 2 days, then 5 tabs for 2 days, then 4 tabs for 2 days, then 3 tabs for 2 days, 2 tabs for 2 days, then 1 tab by mouth daily for 2 days (Patient not taking: Reported on 03/14/2018) 42 tablet 0  . SUMAtriptan (IMITREX) 50 MG tablet Take 50 mg by mouth every 2 (two) hours as needed for migraine or headache. May repeat in 2 hours if headache persists or recurs.     No facility-administered medications prior to visit.     ROS Review of Systems  Constitutional: Negative.   HENT: Negative.   Eyes: Negative.   Respiratory: Negative.   Cardiovascular: Negative.   Gastrointestinal: Negative.   Endocrine: Negative for polyphagia and polyuria.  Genitourinary: Negative.   Musculoskeletal: Positive for back pain.  Skin: Negative.   Allergic/Immunologic: Negative for immunocompromised state.  Neurological: Negative.   Hematological: Negative.   Psychiatric/Behavioral: Negative.     Objective:  BP 100/80   Pulse 95   Temp 98.7 F (37.1 C)   Ht 5\' 5"  (1.651 m)   Wt 186 lb 9.6 oz (84.6 kg)   LMP 03/08/2018   SpO2 99%   BMI 31.05 kg/m   Physical Exam  Constitutional: She is oriented to person, place, and time. She appears well-developed and well-nourished. No distress.  HENT:  Head: Normocephalic and atraumatic.  Right Ear: External ear normal.  Left Ear: External ear  normal.  Mouth/Throat: Oropharynx is clear and moist.  Eyes: Pupils are equal, round, and reactive to light. Conjunctivae and EOM are normal. Right eye exhibits no discharge. Left eye exhibits no discharge. No scleral icterus.  Neck: Neck supple. No JVD present. No tracheal deviation present. No thyromegaly present.  Cardiovascular: Normal rate, regular rhythm and normal heart sounds.  Pulmonary/Chest: Effort normal and breath sounds normal.  Abdominal: Bowel sounds are normal.  Musculoskeletal: She exhibits no edema or tenderness.  Lymphadenopathy:    She has no cervical adenopathy.  Neurological: She is alert  and oriented to person, place, and time.  Skin: Skin is warm and dry. She is not diaphoretic. No pallor.  Psychiatric: She has a normal mood and affect. Her behavior is normal.      Assessment & Plan:   [name] was seen today for establish care.  Diagnoses and all orders for this visit:  Health care maintenance -     Cancel: CBC -     Cancel: Comprehensive metabolic panel -     Cancel: HIV antibody -     Cancel: Lipid panel -     Cancel: Urinalysis, Routine w reflex microscopic -     CBC; Future -     Comprehensive metabolic panel; Future -     HIV antibody; Future -     Lipid panel; Future -     Urinalysis, Routine w reflex microscopic; Future   I am having Elanah T. Acree maintain her SUMAtriptan, naproxen sodium, naproxen, predniSONE, and cyclobenzaprine.  No orders of the defined types were placed in this encounter.  Patient will return fasting to have her labs drawn.  Information was given to her regarding health maintenance and disease prevention.  Follow-up with her GYN for Pap and pelvic exams.  Follow-up: Return return fasting to have your ordered blood work drawn. Libby Maw, MD

## 2018-03-14 NOTE — Patient Instructions (Signed)

## 2018-03-18 ENCOUNTER — Other Ambulatory Visit: Payer: Self-pay

## 2018-03-18 ENCOUNTER — Other Ambulatory Visit (INDEPENDENT_AMBULATORY_CARE_PROVIDER_SITE_OTHER): Payer: BLUE CROSS/BLUE SHIELD

## 2018-03-18 ENCOUNTER — Encounter: Payer: BLUE CROSS/BLUE SHIELD | Admitting: Family Medicine

## 2018-03-18 ENCOUNTER — Other Ambulatory Visit: Payer: BLUE CROSS/BLUE SHIELD

## 2018-03-18 DIAGNOSIS — Z1239 Encounter for other screening for malignant neoplasm of breast: Secondary | ICD-10-CM

## 2018-03-18 DIAGNOSIS — Z0000 Encounter for general adult medical examination without abnormal findings: Secondary | ICD-10-CM | POA: Diagnosis not present

## 2018-03-18 LAB — COMPREHENSIVE METABOLIC PANEL
ALBUMIN: 4.2 g/dL (ref 3.5–5.2)
ALK PHOS: 65 U/L (ref 39–117)
ALT: 15 U/L (ref 0–35)
AST: 17 U/L (ref 0–37)
BILIRUBIN TOTAL: 0.5 mg/dL (ref 0.2–1.2)
BUN: 9 mg/dL (ref 6–23)
CALCIUM: 9.4 mg/dL (ref 8.4–10.5)
CO2: 26 mEq/L (ref 19–32)
CREATININE: 0.7 mg/dL (ref 0.40–1.20)
Chloride: 105 mEq/L (ref 96–112)
GFR: 115.16 mL/min (ref >60.00)
Glucose, Bld: 84 mg/dL (ref 70–99)
Potassium: 3.9 mEq/L (ref 3.5–5.1)
SODIUM: 137 meq/L (ref 135–145)
TOTAL PROTEIN: 7.2 g/dL (ref 6.0–8.3)

## 2018-03-18 LAB — URINALYSIS, ROUTINE W REFLEX MICROSCOPIC
Bilirubin Urine: NEGATIVE
KETONES UR: NEGATIVE
Leukocytes, UA: NEGATIVE
NITRITE: NEGATIVE
Specific Gravity, Urine: >=1.03 — AB (ref 1.000–1.030)
Total Protein, Urine: NEGATIVE
URINE GLUCOSE: NEGATIVE
Urobilinogen, UA: 0.2 (ref 0.0–1.0)
pH: 6 (ref 5.0–8.0)

## 2018-03-18 LAB — LIPID PANEL
CHOLESTEROL: 154 mg/dL (ref 0–200)
HDL: 54.5 mg/dL (ref >39.00)
LDL Cholesterol: 88 mg/dL (ref 0–99)
NonHDL: 99.28
Total CHOL/HDL Ratio: 3
Triglycerides: 57 mg/dL (ref 0.0–149.0)
VLDL: 11.4 mg/dL (ref 0.0–40.0)

## 2018-03-18 LAB — CBC
HCT: 32.5 % — ABNORMAL LOW (ref 36.0–46.0)
Hemoglobin: 11.1 g/dL — ABNORMAL LOW (ref 12.0–15.0)
MCHC: 34.1 g/dL (ref 30.0–36.0)
MCV: 89.4 fl (ref 78.0–100.0)
Platelets: 223 10*3/uL (ref 150.0–400.0)
RBC: 3.64 Mil/uL — ABNORMAL LOW (ref 3.87–5.11)
RDW: 12.6 % (ref 11.5–15.5)
WBC: 2.6 10*3/uL — AB (ref 4.0–10.5)

## 2018-03-19 LAB — HIV ANTIBODY (ROUTINE TESTING W REFLEX): HIV: NONREACTIVE

## 2018-03-24 ENCOUNTER — Ambulatory Visit: Payer: BLUE CROSS/BLUE SHIELD | Admitting: Obstetrics and Gynecology

## 2018-04-14 ENCOUNTER — Ambulatory Visit
Admission: RE | Admit: 2018-04-14 | Discharge: 2018-04-14 | Disposition: A | Discharge status: Home / Self Care | Payer: BLUE CROSS/BLUE SHIELD | Source: Ambulatory Visit | Attending: Family Medicine | Admitting: Family Medicine

## 2018-04-14 DIAGNOSIS — Z1239 Encounter for other screening for malignant neoplasm of breast: Secondary | ICD-10-CM

## 2018-04-18 ENCOUNTER — Encounter: Payer: Self-pay | Admitting: Obstetrics and Gynecology

## 2018-04-18 ENCOUNTER — Ambulatory Visit (INDEPENDENT_AMBULATORY_CARE_PROVIDER_SITE_OTHER): Payer: BLUE CROSS/BLUE SHIELD | Admitting: Obstetrics and Gynecology

## 2018-04-18 VITALS — BP 118/76 | HR 85 | Wt 181.6 lb

## 2018-04-18 DIAGNOSIS — Z124 Encounter for screening for malignant neoplasm of cervix: Secondary | ICD-10-CM

## 2018-04-18 DIAGNOSIS — Z1151 Encounter for screening for human papillomavirus (HPV): Secondary | ICD-10-CM | POA: Diagnosis not present

## 2018-04-18 DIAGNOSIS — Z01419 Encounter for gynecological examination (general) (routine) without abnormal findings: Secondary | ICD-10-CM

## 2018-04-18 NOTE — Progress Notes (Signed)
Subjective:     Bethany Sanders is a 47 y.o. female G1P1 with BMI 30 who is here for a comprehensive physical exam. The patient reports no problems. She is not sexually active. She reports a monthly period lasting 5 days. She denies pelvic pain or abnormal discharge  Past Medical History:  Diagnosis Date  . Migraine    Past Surgical History:  Procedure Laterality Date  . BREAST BIOPSY Left 05/15/2014  . CHOLECYSTECTOMY  2010   Family History  Problem Relation Age of Onset  . Hypertension Father   . Diabetes Father   . High Cholesterol Father   . High Cholesterol Mother   . Early death Mother   . Breast cancer Neg Hx     Social History   Socioeconomic History  . Marital status: Single    Spouse name: Not on file  . Number of children: Not on file  . Years of education: Not on file  . Highest education level: Not on file  Occupational History  . Not on file  Social Needs  . Financial resource strain: Not on file  . Food insecurity:    Worry: Not on file    Inability: Not on file  . Transportation needs:    Medical: Not on file    Non-medical: Not on file  Tobacco Use  . Smoking status: Never Smoker  . Smokeless tobacco: Never Used  Substance and Sexual Activity  . Alcohol use: No  . Drug use: No  . Sexual activity: Not Currently    Birth control/protection: Abstinence  Lifestyle  . Physical activity:    Days per week: Not on file    Minutes per session: Not on file  . Stress: Not on file  Relationships  . Social connections:    Talks on phone: Not on file    Gets together: Not on file    Attends religious service: Not on file    Active member of club or organization: Not on file    Attends meetings of clubs or organizations: Not on file    Relationship status: Not on file  . Intimate partner violence:    Fear of current or ex partner: Not on file    Emotionally abused: Not on file    Physically abused: Not on file    Forced sexual activity: Not on  file  Other Topics Concern  . Not on file  Social History Narrative  . Not on file   Health Maintenance  Topic Date Due  . TETANUS/TDAP  10/29/1989  . INFLUENZA VACCINE  03/10/2018  . PAP SMEAR  12/27/2018  . HIV Screening  Completed       Review of Systems Pertinent items are noted in HPI.   Objective:  Blood pressure 118/76, pulse 85, weight 181 lb 9.6 oz (82.4 kg), last menstrual period 04/12/2018.     GENERAL: Well-developed, well-nourished female in no acute distress.  HEENT: Normocephalic, atraumatic. Sclerae anicteric.  NECK: Supple. Normal thyroid.  LUNGS: Clear to auscultation bilaterally.  HEART: Regular rate and rhythm. BREASTS: Symmetric in size. No palpable masses or lymphadenopathy, skin changes, or nipple drainage. ABDOMEN: Soft, nontender, nondistended. No organomegaly. PELVIC: Normal external female genitalia. Vagina is pink and rugated.  Normal discharge. Normal appearing cervix. Uterus is normal in size. No adnexal mass or tenderness. EXTREMITIES: No cyanosis, clubbing, or edema, 2+ distal pulses.    Assessment:    Healthy female exam.      Plan:    Pap  smear collected Patient with normal mammogram 04/2018 Patient will be contacted with abnormal results RTC in 1 year or prn See After Visit Summary for Counseling Recommendations

## 2018-04-18 NOTE — Progress Notes (Signed)
Patient is in the office for annual exam, declines BC and std testing.

## 2018-04-21 LAB — CYTOLOGY - PAP
DIAGNOSIS: UNDETERMINED — AB
HPV: NOT DETECTED

## 2018-05-30 ENCOUNTER — Ambulatory Visit: Payer: BLUE CROSS/BLUE SHIELD | Admitting: Obstetrics and Gynecology

## 2018-05-30 ENCOUNTER — Encounter: Payer: Self-pay | Admitting: Obstetrics and Gynecology

## 2018-05-30 VITALS — BP 137/82 | HR 84 | Wt 181.7 lb

## 2018-05-30 DIAGNOSIS — Z3202 Encounter for pregnancy test, result negative: Secondary | ICD-10-CM | POA: Diagnosis not present

## 2018-05-30 DIAGNOSIS — Z30430 Encounter for insertion of intrauterine contraceptive device: Secondary | ICD-10-CM

## 2018-05-30 LAB — POCT URINE PREGNANCY: Preg Test, Ur: NEGATIVE

## 2018-05-30 MED ORDER — LEVONORGESTREL 20 MCG/24HR IU IUD
INTRAUTERINE_SYSTEM | Freq: Once | INTRAUTERINE | Status: AC
Start: 2018-05-30 — End: 2018-05-30
  Administered 2018-05-30: 1 via INTRAUTERINE

## 2018-05-30 NOTE — Progress Notes (Signed)
RGYN presents for consult today to discuss contraception management.  Pt states she has a new partner and wants Birth control pt is certain she does not want pills.   LMP:05/26/18  Periods are normal lasting 6 days Pt states cycles are heavy. Last pap: 04/18/2018 [facility] - HPV

## 2018-05-30 NOTE — Progress Notes (Signed)
Patient is here for contraception counseling. She has a new partner and is interested in contraception, particularly the IUD. She has not been sexually active with him yet. Patient reminded to use condoms for STD prevention  IUD Procedure Note Patient identified, informed consent performed, signed copy in chart, time out was performed.  Urine pregnancy test negative.  Speculum placed in the vagina.  Cervix visualized.  Cleaned with Betadine x 2.  Grasped anteriorly with a single tooth tenaculum.  Uterus sounded to 8 cm.  Mirena IUD placed per manufacturer's recommendations.  Strings trimmed to 3 cm. Tenaculum was removed, good hemostasis noted.  Patient tolerated procedure well.   Patient given post procedure instructions and Mirena care card with expiration date.  Patient is asked to check IUD strings periodically and follow up in 4-6 weeks for IUD check.

## 2018-06-16 ENCOUNTER — Ambulatory Visit: Payer: BLUE CROSS/BLUE SHIELD | Admitting: Family Medicine

## 2018-06-16 ENCOUNTER — Encounter: Payer: Self-pay | Admitting: Family Medicine

## 2018-06-16 ENCOUNTER — Ambulatory Visit: Payer: Self-pay

## 2018-06-16 VITALS — BP 124/80 | HR 70 | Temp 97.9°F | Ht 65.0 in | Wt 181.2 lb

## 2018-06-16 DIAGNOSIS — E66811 Obesity, class 1: Secondary | ICD-10-CM

## 2018-06-16 DIAGNOSIS — J4521 Mild intermittent asthma with (acute) exacerbation: Secondary | ICD-10-CM | POA: Diagnosis not present

## 2018-06-16 DIAGNOSIS — J45909 Unspecified asthma, uncomplicated: Secondary | ICD-10-CM | POA: Insufficient documentation

## 2018-06-16 DIAGNOSIS — D229 Melanocytic nevi, unspecified: Secondary | ICD-10-CM

## 2018-06-16 DIAGNOSIS — D72819 Decreased white blood cell count, unspecified: Secondary | ICD-10-CM | POA: Diagnosis not present

## 2018-06-16 DIAGNOSIS — D649 Anemia, unspecified: Secondary | ICD-10-CM

## 2018-06-16 DIAGNOSIS — E6609 Other obesity due to excess calories: Secondary | ICD-10-CM

## 2018-06-16 DIAGNOSIS — K219 Gastro-esophageal reflux disease without esophagitis: Secondary | ICD-10-CM

## 2018-06-16 DIAGNOSIS — Z6830 Body mass index (BMI) 30.0-30.9, adult: Secondary | ICD-10-CM

## 2018-06-16 LAB — CBC
HCT: 35.3 % — ABNORMAL LOW (ref 36.0–46.0)
Hemoglobin: 11.8 g/dL — ABNORMAL LOW (ref 12.0–15.0)
MCHC: 33.4 g/dL (ref 30.0–36.0)
MCV: 91.3 fl (ref 78.0–100.0)
PLATELETS: 258 10*3/uL (ref 150.0–400.0)
RBC: 3.86 Mil/uL — ABNORMAL LOW (ref 3.87–5.11)
RDW: 12.8 % (ref 11.5–15.5)
WBC: 5.3 10*3/uL (ref 4.0–10.5)

## 2018-06-16 LAB — VITAMIN B12: Vitamin B-12: 321 pg/mL (ref 211–911)

## 2018-06-16 LAB — TSH: TSH: 1.32 u[IU]/mL (ref 0.35–4.50)

## 2018-06-16 MED ORDER — PANTOPRAZOLE SODIUM 40 MG PO TBEC: 40.0000 mg | DELAYED_RELEASE_TABLET | Freq: Every day | ORAL | 3 refills | Status: AC

## 2018-06-16 MED ORDER — PREDNISONE 10 MG PO TABS: 10.0000 mg | ORAL_TABLET | Freq: Every day | ORAL | 2 refills | Status: DC

## 2018-06-16 NOTE — Telephone Encounter (Signed)
Pt c/o chest pain that occurs after eating. Pt stated that she has a cold and has been coughing. Pt c/o sore throat and back pain. Pt stated that the pain is between her breasts. Pt stated that the pain comes and goes but comes back after eating. Pt stated that she was taking medication before for heart burn. Pt stated that she feels uncomfortable after eating food. Pt c/o fatigue.  Care advice given an pt verbalized understanding. Appointment given to pt for today at 11:00 am with Dr Ethelene Hal.   Reason for Disposition . [1] Chest pain(s) lasting a few seconds AND [2] persists > 3 days  Answer Assessment - Initial Assessment Questions 1. LOCATION: "Where does it hurt?"       Between breasts  In the middle 2. RADIATION: "Does the pain go anywhere else?" (e.g., into neck, jaw, arms, back)     Back, throat  3. ONSET: "When did the chest pain begin?" (Minutes, hours or days)      1 week ago 4. PATTERN "Does the pain come and go, or has it been constant since it started?"  "Does it get worse with exertion?"      Comes and goes but comes back with eating- feels like heartburn 5. DURATION: "How long does it last" (e.g., seconds, minutes, hours)     Pt doesn't know 6. SEVERITY: "How bad is the pain?"  (e.g., Scale 1-10; mild, moderate, or severe)    - MILD (1-3): doesn't interfere with normal activities     - MODERATE (4-7): interferes with normal activities or awakens from sleep    - SEVERE (8-10): excruciating pain, unable to do any normal activities       Moderate- wakes from sleep 7. CARDIAC RISK FACTORS: "Do you have any history of heart problems or risk factors for heart disease?" (e.g., prior heart attack, angina; high blood pressure, diabetes, being overweight, high cholesterol, smoking, or strong family history of heart disease)     20 lbs overweight, 8. PULMONARY RISK FACTORS: "Do you have any history of lung disease?"  (e.g., blood clots in lung, asthma, emphysema, birth control pills)  no 9. CAUSE: "What do you think is causing the chest pain?"     heartburn 10. OTHER SYMPTOMS: "Do you have any other symptoms?" (e.g., dizziness, nausea, vomiting, sweating, fever, difficulty breathing, cough)       Fatigue, cough 11. PREGNANCY: "Is there any chance you are pregnant?" "When was your last menstrual period?"       n/a  Protocols used: CHEST PAIN-A-AH

## 2018-06-16 NOTE — Progress Notes (Signed)
Subjective:  Patient ID: Bethany Sanders, female    DOB: 05-26-1971  Age: 47 y.o. MRN: [id number]  CC: Chest Pain (possible heart burn) and Cough   HPI Bethany Sanders presents for treatment and evaluation of multiple issues.  She currently is a little tight in her chest with wheezing.  This seems to have started 6 days ago with nasal congestion postnasal drip and sore throat that are currently resolving.  She has no history of asthma or tobacco use.  There is no family history of asthma.  She tells of an ongoing history of intermittent reflux.  It is been treated in her past.  She notices increased burping.  Some foods that tend to set it off.  She denies dysphagia, exertional chest pain or shortness of breath.  She takes an Aleve maybe once or twice a week or body aches and pains.  She tells of ongoing fatigue would not be specific as to the length of that feeling.  She does have a past medical history of anemia.  It was normocytic.  She has a mole on her left shin that she feels is changing.  She tells me she feels as though there is little pleasure in her life but denies frank depression.  She continues to gain weight even though she is trying to moderate her diet and continues to exercise on a regular basis.  She denies any thoughts of self-harm.  She bought a dog home about 4 months ago and sleeps with it.  She is not certain as to whether or not she may be allergic to it.  She does have springtime allergies but has not experienced these in the fall.  History Bethany Sanders has a past medical history of Migraine.   She has a past surgical history that includes Cholecystectomy (2010) and Breast biopsy (Left, 05/15/2014).   Her family history includes Diabetes in her father; Early death in her mother; High Cholesterol in her father and mother; Hypertension in her father.She reports that she has never smoked. She has never used smokeless tobacco. She reports that she does not drink alcohol or use  drugs.  Outpatient Medications Prior to Visit  Medication Sig Dispense Refill  . naproxen sodium (ALEVE) 220 MG tablet Take 220 mg by mouth.    . SUMAtriptan (IMITREX) 50 MG tablet Take 50 mg by mouth every 2 (two) hours as needed for migraine or headache. May repeat in 2 hours if headache persists or recurs.    . cyclobenzaprine (FLEXERIL) 10 MG tablet Take 1 tablet (10 mg total) by mouth 2 (two) times daily as needed for muscle spasms. (Patient not taking: Reported on 03/14/2018) 20 tablet 0  . naproxen (NAPROSYN) 500 MG tablet Take 1 tablet (500 mg total) by mouth 2 (two) times daily. (Patient not taking: Reported on 03/14/2018) 30 tablet 0  . predniSONE (STERAPRED UNI-PAK 21 TAB) 10 MG (21) TBPK tablet Take by mouth daily. Take 6 tabs by mouth daily  for 2 days, then 5 tabs for 2 days, then 4 tabs for 2 days, then 3 tabs for 2 days, 2 tabs for 2 days, then 1 tab by mouth daily for 2 days (Patient not taking: Reported on 03/14/2018) 42 tablet 0   No facility-administered medications prior to visit.     ROS Review of Systems  Constitutional: Positive for fatigue. Negative for chills, diaphoresis, fever and unexpected weight change.  HENT: Positive for congestion and postnasal drip. Negative for sinus pressure, sinus pain  and sore throat.   Eyes: Negative for photophobia and visual disturbance.  Respiratory: Positive for wheezing. Negative for cough and shortness of breath.   Cardiovascular: Negative.   Gastrointestinal: Negative.   Endocrine: Negative for polyphagia and polyuria.  Genitourinary: Negative.   Musculoskeletal: Negative for gait problem and myalgias.  Skin: Positive for color change.  Allergic/Immunologic: Negative for immunocompromised state.  Neurological: Negative for weakness, light-headedness and numbness.  Hematological: Does not bruise/bleed easily.  Psychiatric/Behavioral: Negative for dysphoric mood, self-injury and suicidal ideas.    Objective:  BP 124/80 (BP  Location: Left Arm, Patient Position: Sitting, Cuff Size: Normal)   Pulse 70   Temp 97.9 F (36.6 C) (Oral)   Ht 5\' 5"  (1.651 m)   Wt 181 lb 4 oz (82.2 kg)   LMP 05/26/2018 (Exact Date)   SpO2 99%   BMI 30.16 kg/m   Physical Exam  Constitutional: She is oriented to person, place, and time. She appears well-developed and well-nourished.  Non-toxic appearance. She does not appear ill. No distress.  HENT:  Head: Normocephalic and atraumatic.  Eyes: EOM are normal.  Neck: Neck supple. No hepatojugular reflux and no JVD present. No tracheal deviation present. No thyromegaly present.  Cardiovascular: Normal rate and regular rhythm.  Pulmonary/Chest: Breath sounds normal. No accessory muscle usage. No respiratory distress. She has no decreased breath sounds. She has no wheezes. She has no rales.  Lymphadenopathy:    She has no cervical adenopathy.  Neurological: She is alert and oriented to person, place, and time.  Skin: Skin is warm and dry.     Psychiatric: She has a normal mood and affect. Her behavior is normal. Her mood appears not anxious. She is not agitated.    Depression screen [facility] 2/9 06/16/2018 03/14/2018  Decreased Interest 3 0  Down, Depressed, Hopeless 0 0  PHQ - 2 Score 3 0  Altered sleeping 3 -  Tired, decreased energy 3 -  Change in appetite 1 -  Feeling bad or failure about yourself  1 -  Trouble concentrating 1 -  Moving slowly or fidgety/restless 0 -  Suicidal thoughts 0 -  PHQ-9 Score 12 -     Assessment & Plan:   Bethany Sanders was seen today for chest pain and cough.  Diagnoses and all orders for this visit:  Mild intermittent reactive airway disease with acute exacerbation -     Allergy Panel, [employer] -     predniSONE (DELTASONE) 10 MG tablet; Take 1 tablet (10 mg total) by mouth daily with breakfast.  Gastroesophageal reflux disease, esophagitis presence not specified -     pantoprazole (PROTONIX) 40 MG tablet; Take 1 tablet (40 mg total) by mouth  daily.  Atypical nevus -     Ambulatory referral to Dermatology  Anemia, unspecified type -     Vitamin B12 -     CBC -     Iron, TIBC and Ferritin Panel -     TSH -     Serum protein electrophoresis with reflex  Class 1 obesity due to excess calories without serious comorbidity with body mass index (BMI) of 30.0 to 30.9 in adult -     Amb ref to Medical Nutrition Therapy-MNT  Leukopenia, unspecified type -     CBC -     Serum protein electrophoresis with reflex   I have discontinued Bethany Sanders naproxen, predniSONE, and cyclobenzaprine. I am also having her start on predniSONE and pantoprazole. Additionally, I am having her maintain  her SUMAtriptan and naproxen sodium.  Meds ordered this encounter  Medications  . predniSONE (DELTASONE) 10 MG tablet    Sig: Take 1 tablet (10 mg total) by mouth daily with breakfast.    Dispense:  30 tablet    Refill:  2  . pantoprazole (PROTONIX) 40 MG tablet    Sig: Take 1 tablet (40 mg total) by mouth daily.    Dispense:  30 tablet    Refill:  3   Patient denies frank depression at this time and is not interested in treatment for that issue.  Will give her low-dose prednisone for 7 days to help the wheezing and tightness.  She will go ahead and take Protonix continuously for a month.  Recheck anemia with iron and B12 levels.  There is been loop consistent leukopenia and serial CBCs.  We will go ahead and check hemoglobin electrophoresis.  Follow-up in 1 month.  Follow-up: Return in about 1 month (around 07/16/2018).  Libby Maw, MD

## 2018-06-17 LAB — ALLERGY PANEL, ANIMAL GROUP
Allergen, Chicken feather, e91: <0.1 kU/L
Allergen, Mouse Urine Protein, e78: <0.1 kU/L
Allergen,Goose feathers, e70: <0.1 kU/L
CLASS: 0
CLASS: 0
CLASS: 0
CLASS: 0
Class: 0
Cow Dander IgE: <0.1 kU/L
Horse dander: <0.1 kU/L

## 2018-06-17 LAB — IRON,TIBC AND FERRITIN PANEL
%SAT: 20 % (ref 16–45)
Ferritin: 67 ng/mL (ref 16–232)
IRON: 81 ug/dL (ref 40–190)
TIBC: 404 ug/dL (ref 250–450)

## 2018-06-17 LAB — INTERPRETATION:

## 2018-06-20 ENCOUNTER — Encounter: Payer: Self-pay | Admitting: Dietician

## 2018-06-20 ENCOUNTER — Encounter: Payer: BLUE CROSS/BLUE SHIELD | Attending: Family Medicine | Admitting: Dietician

## 2018-06-20 DIAGNOSIS — Z6830 Body mass index (BMI) 30.0-30.9, adult: Secondary | ICD-10-CM | POA: Insufficient documentation

## 2018-06-20 DIAGNOSIS — E6609 Other obesity due to excess calories: Secondary | ICD-10-CM

## 2018-06-20 DIAGNOSIS — Z713 Dietary counseling and surveillance: Secondary | ICD-10-CM | POA: Diagnosis not present

## 2018-06-20 LAB — PROTEIN ELECTROPHORESIS, SERUM, WITH REFLEX
Albumin ELP: 4.3 g/dL (ref 3.8–4.8)
Alpha 1: 0.3 g/dL (ref 0.2–0.3)
Alpha 2: 0.8 g/dL (ref 0.5–0.9)
BETA GLOBULIN: 0.4 g/dL (ref 0.4–0.6)
Beta 2: 0.4 g/dL (ref 0.2–0.5)
GAMMA GLOBULIN: 1 g/dL (ref 0.8–1.7)
TOTAL PROTEIN: 7.3 g/dL (ref 6.1–8.1)

## 2018-06-20 NOTE — Patient Instructions (Addendum)
Recommend 8 cups fluid per day.  Drink more water. Consider a vitamin D supplement (1000-2000 units daily). Consider a sublingual (or dissolvable) vitamin B-12 daily. See the list of foods that are high in sodium.  Continue healthy habits. Be mindful about oil intake.  This has a lot of calories. Meat should be lean, take skin off the chicken.  Bake rather than fry. Stay active. Mindful eating:   Eat slowly and stop when you are satisfied. Half your plate should be vegetables, 1/4 of your plate starch 1/4 of your plate a lean protein

## 2018-06-20 NOTE — Progress Notes (Signed)
  Medical Nutrition Therapy:  Appt start time: 1761 end time:  1500. Arrived at the wrong location.  Assessment:  Primary concerns today: Patient is here alone.  She would like to learn what to eat to lose weight.  She is going to the gym but has been gaining weight. She complains of being tired all of the time. Vitamin B-12 06/16/18 was 321, TSH 1/32, Hemoglobin 11.8 and HCT 35.3.  She is still having periods which are heavy. History includes back pain with injections and steroid use at times. She lost weight in the past by decreasing her portions and drank Herbal life shakes. States that she sleeps well- 8 hours most nights. She has been trying to eat healthy for 3 months and lost 10 lbs but has been regaining since starting exercise for the past month.  Weight hx: Weight today 184 lbs which patient stated is increased from this weekend. This is her highest weight. 150 lbs is her goal weight. 120 lbs lowest weight 12 years ago prior to pregnancy.  Patient lives with her 11 yo daughter.  Patient does the shopping and cooking.  She works as Tourist information centre manager.  Preferred Learning Style:   No preference indicated   Learning Readiness:   Ready  Change in progress   MEDICATIONS: none   DIETARY INTAKE: Does not eat out. Dislikes water 24-hr recall:  B ( AM): oatmeal with blueberries, honey  Snk ( AM):   L ( PM): beef, rice, vegetable OR fish, rice, vegetables Snk (4 PM): green apple or peach D ( PM): smoothie (apple, berry, spinach, water, cucumber, parsley) Snk ( PM): none Beverages:  Coffee with 2% milk, honey- 1-2 times daily, smoothie, 1 cup water, juice  Usual physical activity: 5 days per week 1 hour on treadmill, bike for 30 minutes and second bike for 30 minute plus stomach exercises (2 hours total)  Estimated energy needs: 1500 calories 70 g protein  Progress Towards Goal(s):  In progress.   Nutritional Diagnosis:  NB-1.1 Food and nutrition-related knowledge  deficit As related to healthy eating and balance of nutrition.  As evidenced by patient report adnd weight gain depite increased exercise.    Intervention:  Nutrition counseling/education related to good nutrition. Recommend 8 cups fluid per day.  Drink more water. Consider a vitamin D supplement (1000-2000 units daily). Consider a sublingual (or dissolvable) vitamin B-12 daily. See the list of foods that are high in sodium.  Continue healthy habits. Be mindful about oil intake.  This has a lot of calories. Meat should be lean, take skin off the chicken.  Bake rather than fry. Stay active. Mindful eating:   Eat slowly and stop when you are satisfied. Half your plate should be vegetables, 1/4 of your plate starch 1/4 of your plate a lean protein  Teaching Method Utilized:  Visual Auditory  Handouts given during visit include:  Iron Rich Nutrition Therapy  Weight loss tips from AND  Cooking tips for weight loss from AND  1500 calorie sample menus  Barriers to learning/adherence to lifestyle change: unknown  Demonstrated degree of understanding via:  Teach Back   Monitoring/Evaluation:  Dietary intake, exercise, and body weight prn.

## 2018-06-27 ENCOUNTER — Encounter: Payer: Self-pay | Admitting: Obstetrics and Gynecology

## 2018-06-27 ENCOUNTER — Ambulatory Visit (INDEPENDENT_AMBULATORY_CARE_PROVIDER_SITE_OTHER): Payer: BLUE CROSS/BLUE SHIELD | Admitting: Obstetrics and Gynecology

## 2018-06-27 VITALS — BP 127/82 | HR 65 | Wt 189.8 lb

## 2018-06-27 DIAGNOSIS — Z30431 Encounter for routine checking of intrauterine contraceptive device: Secondary | ICD-10-CM | POA: Diagnosis not present

## 2018-06-27 NOTE — Progress Notes (Signed)
Pt is in the office for IUD string check, pt denies any issues. IUD inserted on 05-30-18.

## 2018-06-27 NOTE — Progress Notes (Signed)
47 yo P1 here for IUD check. Patient reports feeling well with some mild cramping pain and light spotting. She has been sexually active without pain  Past Medical History:  Diagnosis Date  . Migraine    Past Surgical History:  Procedure Laterality Date  . BREAST BIOPSY Left 05/15/2014  . CHOLECYSTECTOMY  2010   Family History  Problem Relation Age of Onset  . Hypertension Father   . Diabetes Father   . High Cholesterol Father   . High Cholesterol Mother   . Early death Mother   . Breast cancer Neg Hx    Social History   Tobacco Use  . Smoking status: Never Smoker  . Smokeless tobacco: Never Used  Substance Use Topics  . Alcohol use: No  . Drug use: No   ROS See pertinent in HPI  Blood pressure 127/82, pulse 65, weight 189 lb 12.8 oz (86.1 kg). GENERAL: Well-developed, well-nourished female in no acute distress.  ABDOMEN: Soft, nontender, nondistended. No organomegaly. PELVIC: Normal external female genitalia. Vagina is pink and rugated.  Normal discharge. Normal appearing cervix with IUD strings visualized at the os. Uterus is normal in size. No adnexal mass or tenderness. EXTREMITIES: No cyanosis, clubbing, or edema, 2+ distal pulses.  A/P 47 yo here for IUD check - IUD appears to be in the appropriate location - RTC in September for annual exam with pap smear

## 2019-03-13 IMAGING — MG DIGITAL SCREENING BILATERAL MAMMOGRAM WITH TOMO AND CAD
8 series · 8 of 24 positions shown · non-contrast
Comparison: Previous exam(s).

CLINICAL DATA: Screening.

EXAM:
DIGITAL SCREENING BILATERAL MAMMOGRAM WITH TOMO AND CAD

[L MLO synth-2D]
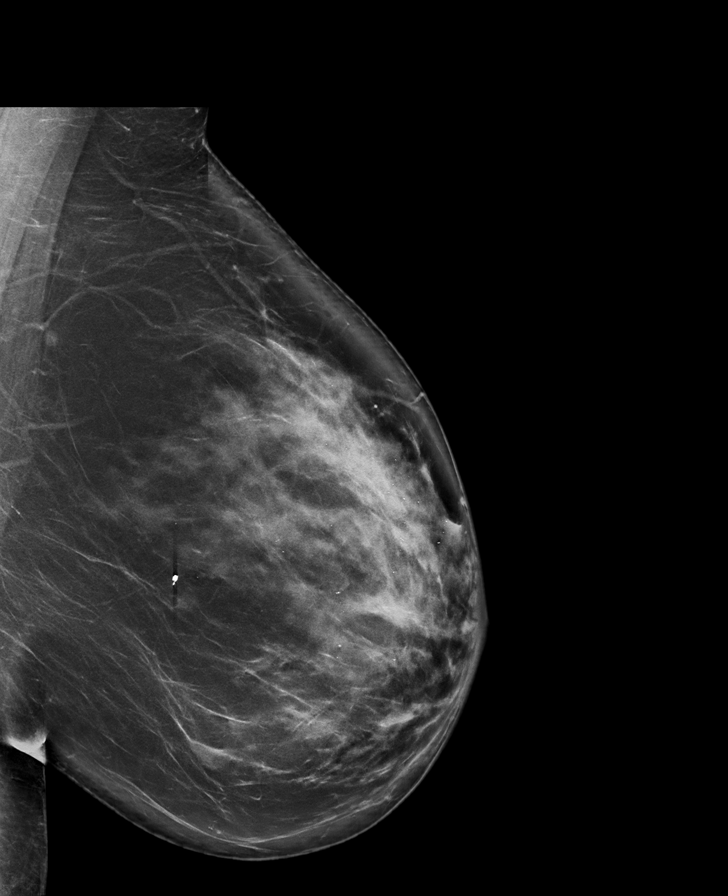

[R CC synth-2D]
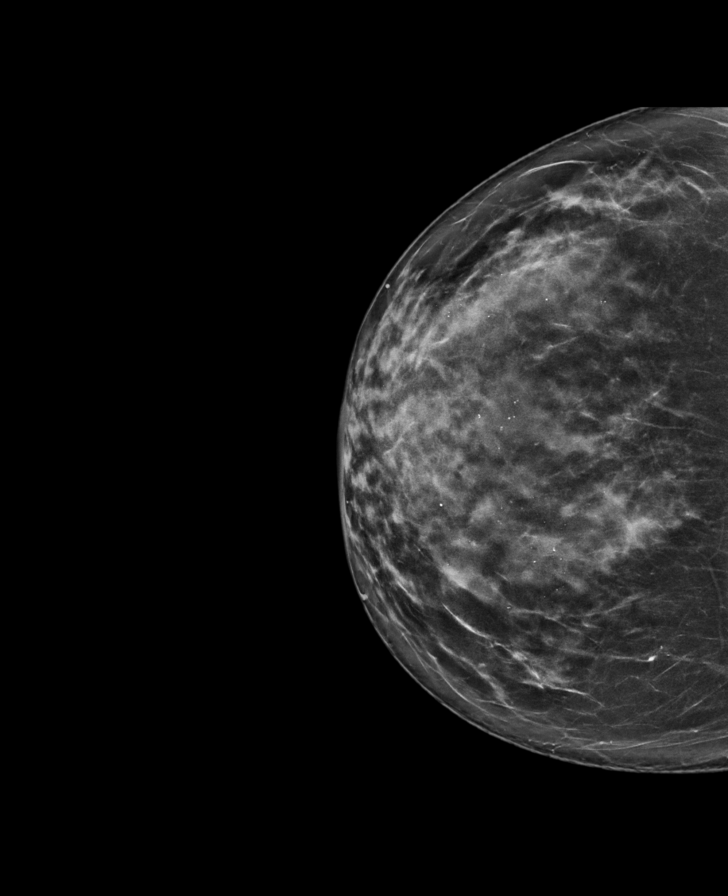

[L CC synth-2D]
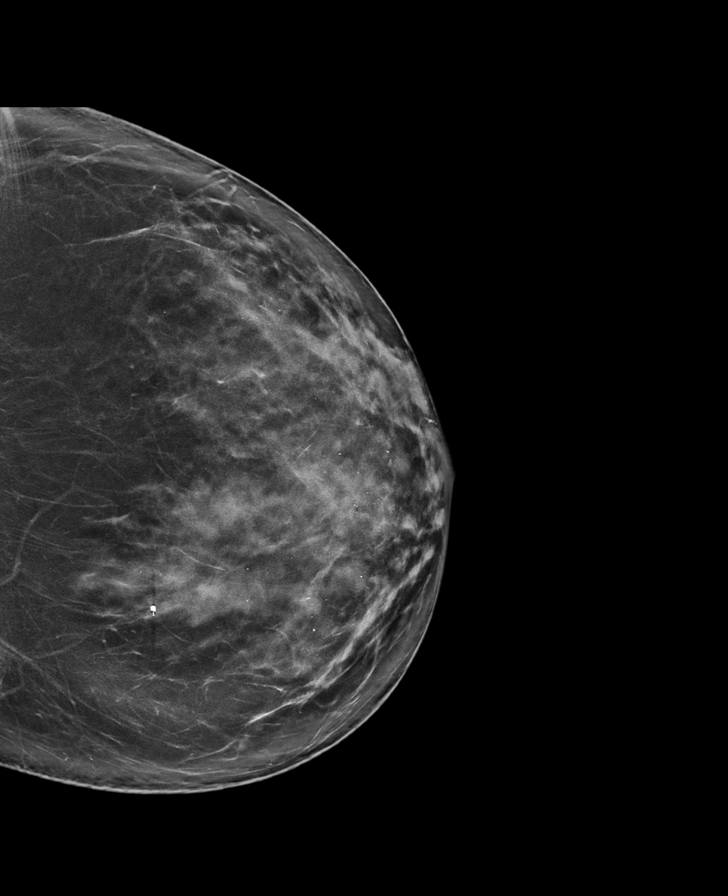

[R MLO synth-2D]
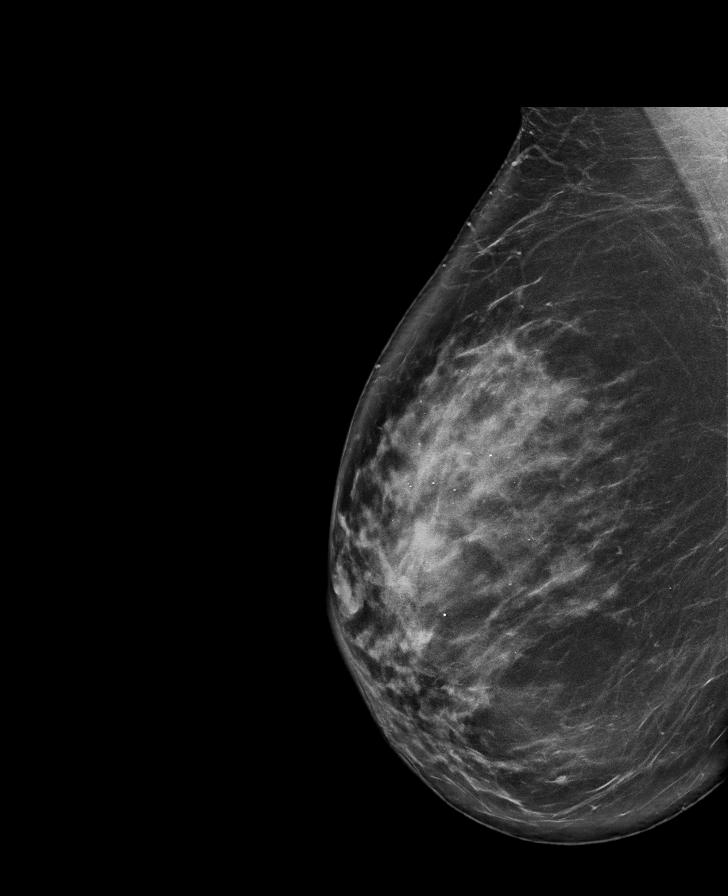

[L CC tomo · tomo slice 46/91.0]
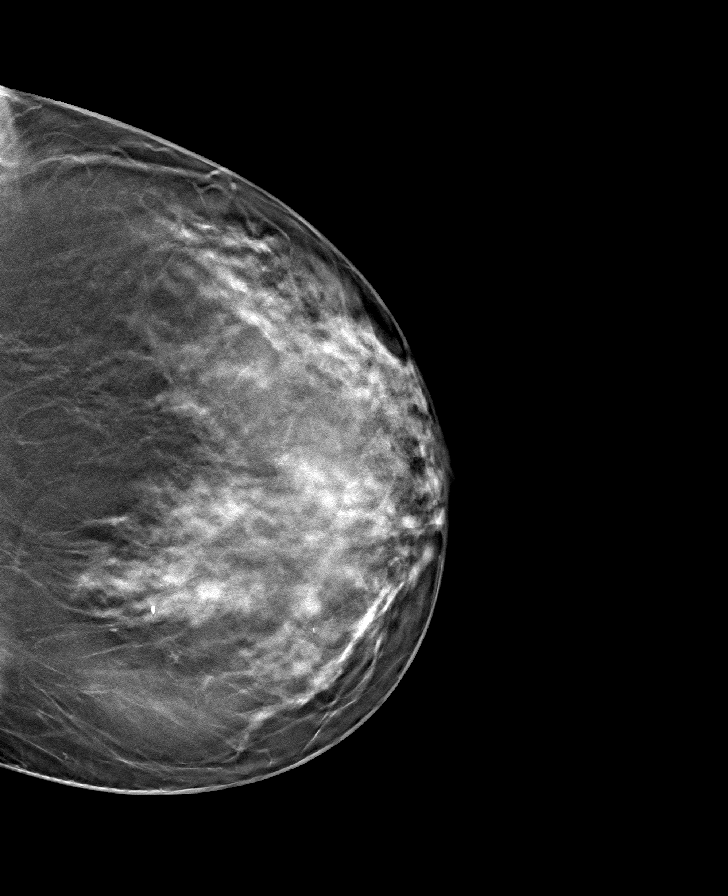

[L MLO tomo · tomo slice 51/100.0]
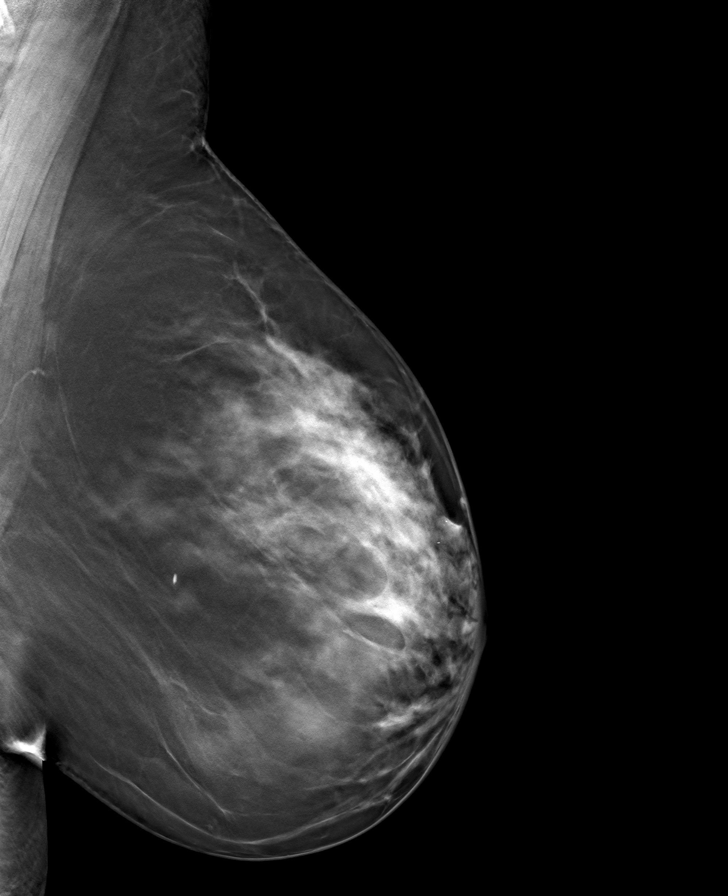

[R CC tomo · tomo slice 43/84.0]
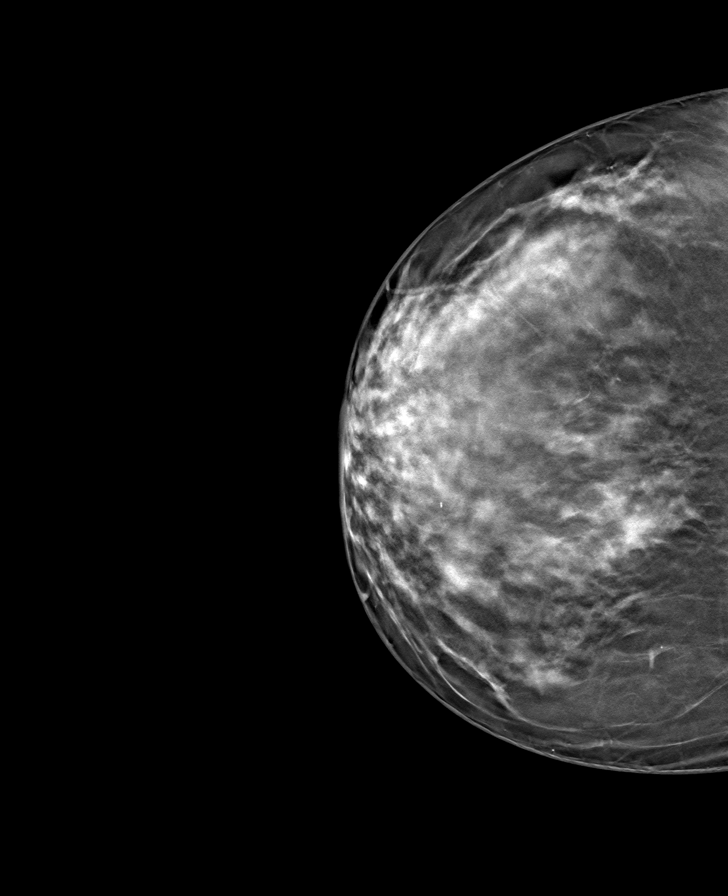

[R MLO tomo · tomo slice 48/95.0]
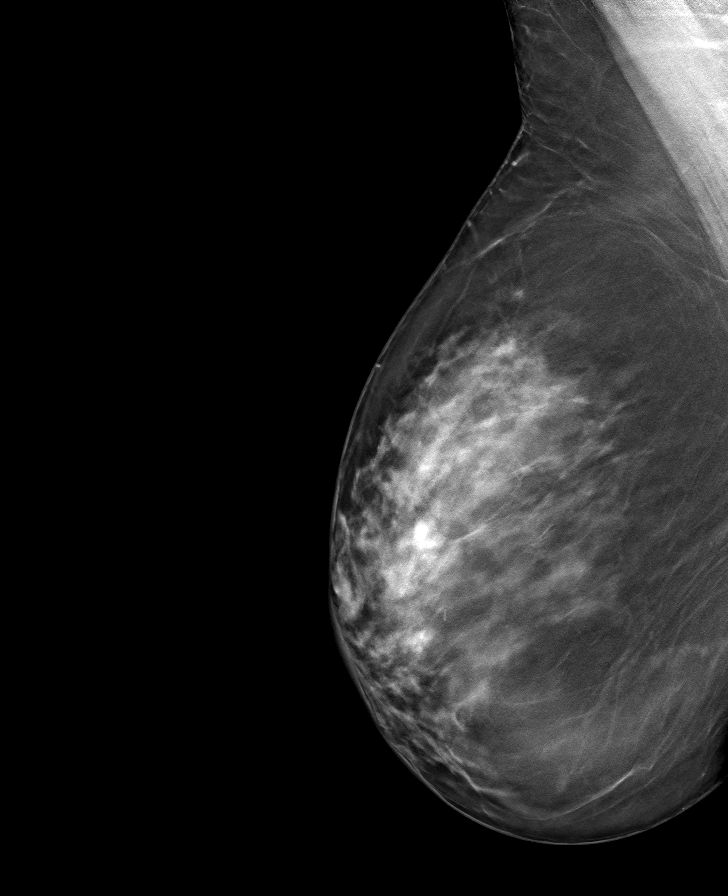

[8 of 24 positions shown; findings below may reference images not displayed]

ACR Breast Density Category c: The breast tissue is heterogeneously
dense, which may obscure small masses.
FINDINGS: There are no findings suspicious for malignancy. Images were
processed with CAD.
IMPRESSION: No mammographic evidence of malignancy. A result letter of this
screening mammogram will be mailed directly to the patient.

RECOMMENDATION:
Screening mammogram in one year. (Code:[id number]-U-LHB)

BI-RADS CATEGORY  1: Negative.

## 2019-12-27 ENCOUNTER — Ambulatory Visit: Payer: BLUE CROSS/BLUE SHIELD | Attending: Internal Medicine

## 2019-12-27 DIAGNOSIS — Z20822 Contact with and (suspected) exposure to covid-19: Secondary | ICD-10-CM

## 2019-12-28 LAB — SARS-COV-2, NAA 2 DAY TAT

## 2019-12-28 LAB — NOVEL CORONAVIRUS, NAA: SARS-CoV-2, NAA: NOT DETECTED

## 2020-03-19 ENCOUNTER — Other Ambulatory Visit: Payer: BLUE CROSS/BLUE SHIELD

## 2020-03-19 ENCOUNTER — Other Ambulatory Visit: Payer: Self-pay | Admitting: Cardiology

## 2020-03-19 DIAGNOSIS — Z20822 Contact with and (suspected) exposure to covid-19: Secondary | ICD-10-CM

## 2020-03-20 LAB — NOVEL CORONAVIRUS, NAA: SARS-CoV-2, NAA: NOT DETECTED

## 2020-03-20 LAB — SARS-COV-2, NAA 2 DAY TAT

## 2023-03-03 ENCOUNTER — Ambulatory Visit (INDEPENDENT_AMBULATORY_CARE_PROVIDER_SITE_OTHER): Payer: BLUE CROSS/BLUE SHIELD

## 2023-03-03 ENCOUNTER — Ambulatory Visit (HOSPITAL_COMMUNITY)
Admission: RE | Admit: 2023-03-03 | Discharge: 2023-03-03 | Disposition: A | Discharge status: Home / Self Care | Payer: BLUE CROSS/BLUE SHIELD | Source: Ambulatory Visit | Attending: Family Medicine

## 2023-03-03 ENCOUNTER — Encounter (HOSPITAL_COMMUNITY): Payer: Self-pay

## 2023-03-03 VITALS — BP 143/65 | HR 72 | Resp 17

## 2023-03-03 DIAGNOSIS — J209 Acute bronchitis, unspecified: Secondary | ICD-10-CM

## 2023-03-03 MED ORDER — PREDNISONE 20 MG PO TABS: 40.0000 mg | ORAL_TABLET | Freq: Every day | ORAL | 0 refills | Status: AC

## 2023-03-03 MED ORDER — PROMETHAZINE-DM 6.25-15 MG/5ML PO SYRP: 5.0000 mL | ORAL_SOLUTION | Freq: Four times a day (QID) | ORAL | 0 refills | Status: AC | PRN

## 2023-03-03 MED ORDER — ALBUTEROL SULFATE HFA 108 (90 BASE) MCG/ACT IN AERS: 2.0000 | INHALATION_SPRAY | Freq: Four times a day (QID) | RESPIRATORY_TRACT | 0 refills | Status: AC | PRN

## 2023-03-03 NOTE — ED Provider Notes (Signed)
[facility]    CSN: [id number] Arrival date & time: 03/03/23  1706      History   Chief Complaint Chief Complaint  Patient presents with   Cough    Entered by patient    HPI Bethany Sanders is a 52 y.o. female.   HPI Patient with a history of reactive airway disease and GERD presents today with a 10-day history of cough. Cough has been productive at times however for the last 4 days has been more dry.  Taking over-the-counter cough and cold medications without any improvement of cough.  She ended the shortness of breath that began today.  She has not had fever.  Endorses some nasal drainage without congestion.  Denies any known sick contacts.  Past Medical History:  Diagnosis Date   Migraine     Patient Active Problem List   Diagnosis Date Noted   Reactive airway disease 06/16/2018   Gastroesophageal reflux disease 06/16/2018   Atypical nevus 06/16/2018   Anemia 06/16/2018   Class 1 obesity due to excess calories without serious comorbidity with body mass index (BMI) of 30.0 to 30.9 in adult 06/16/2018   Leukopenia 06/16/2018   Health care maintenance 03/14/2018   Intermenstrual bleeding 07/19/2013   Fibroids, subserous 07/19/2013    Past Surgical History:  Procedure Laterality Date   BREAST BIOPSY Left 05/15/2014   CHOLECYSTECTOMY  2010    OB History     Gravida  1   Para  1   Term      Preterm      AB      Living  1      SAB      IAB      Ectopic      Multiple      Live Births  1            Home Medications    Prior to Admission medications   Medication Sig Start Date End Date Taking? Authorizing Provider  albuterol (VENTOLIN HFA) 108 (90 Base) MCG/ACT inhaler Inhale 2 puffs into the lungs every 6 (six) hours as needed for wheezing or shortness of breath. 03/03/23  Yes Bing Neighbors, NP  predniSONE (DELTASONE) 20 MG tablet Take 2 tablets (40 mg total) by mouth daily with breakfast. 03/03/23  Yes Bing Neighbors,  NP  promethazine-dextromethorphan (PROMETHAZINE-DM) 6.25-15 MG/5ML syrup Take 5 mLs by mouth 4 (four) times daily as needed for cough. 03/03/23  Yes Bing Neighbors, NP  naproxen sodium (ALEVE) 220 MG tablet Take 220 mg by mouth.    [provider]  pantoprazole (PROTONIX) 40 MG tablet Take 1 tablet (40 mg total) by mouth daily. Patient not taking: Reported on 06/20/2018 06/16/18   Mliss Sax, MD  SUMAtriptan (IMITREX) 50 MG tablet Take 50 mg by mouth every 2 (two) hours as needed for migraine or headache. May repeat in 2 hours if headache persists or recurs.    [provider]    Family History Family History  Problem Relation Age of Onset   Hypertension Father    Diabetes Father    High Cholesterol Father    High Cholesterol Mother    Early death Mother    Breast cancer Neg Hx     Social History Social History   Tobacco Use   Smoking status: Never   Smokeless tobacco: Never  Substance Use Topics   Alcohol use: No   Drug use: No     Allergies  Aspirin   Review of Systems Review of Systems Pertinent negatives listed in HPI   Physical Exam Triage Vital Signs ED Triage Vitals [03/03/23 1734]  Encounter Vitals Group     BP (!) 143/65     Systolic BP Percentile      Diastolic BP Percentile      Pulse Rate 72     Resp 17     Temp      Temp src      SpO2 96 %     Weight      Height      Head Circumference      Peak Flow      Pain Score 0     Pain Loc      Pain Education      Exclude from Growth Chart    No data found.  Updated Vital Signs BP (!) 143/65 (BP Location: Right Arm)   Pulse 72   Resp 17   LMP 03/03/2023   SpO2 96%   Visual Acuity Right Eye Distance:   Left Eye Distance:   Bilateral Distance:    Right Eye Near:   Left Eye Near:    Bilateral Near:     Physical Exam Vitals reviewed.  Constitutional:      Appearance: Normal appearance.  HENT:     Head: Normocephalic and atraumatic.  Eyes:      Extraocular Movements: Extraocular movements intact.     Conjunctiva/sclera: Conjunctivae normal.     Pupils: Pupils are equal, round, and reactive to light.  Cardiovascular:     Rate and Rhythm: Normal rate and regular rhythm.  Pulmonary:     Effort: Pulmonary effort is normal.     Breath sounds: Normal breath sounds.  Musculoskeletal:     Cervical back: Normal range of motion and neck supple.  Skin:    General: Skin is warm and dry.     Capillary Refill: Capillary refill takes less than 2 seconds.  Neurological:     General: No focal deficit present.     Mental Status: She is alert and oriented to person, place, and time.      UC Treatments / Results  Labs (all labs ordered are listed, but only abnormal results are displayed) Labs Reviewed - No data to display  EKG   Radiology DG Chest 2 View  Result Date: 03/03/2023 CLINICAL DATA:  Cough for several days, initial encounter EXAM: CHEST - 2 VIEW COMPARISON:  01/27/2021 FINDINGS: The heart size and mediastinal contours are within normal limits. Both lungs are clear. The visualized skeletal structures are unremarkable. IMPRESSION: No active cardiopulmonary disease. Electronically Signed   By: Alcide Clever M.D.   On: 03/03/2023 18:12    Procedures Procedures (including critical care time)  Medications Ordered in UC Medications - No data to display  Initial Impression / Assessment and Plan / UC Course  I have reviewed the triage vital signs and the nursing notes.  Pertinent labs & imaging results that were available during my care of the patient were reviewed by me and considered in my medical decision making (see chart for details).    Acute Bronchitis, treatment per discharge medication orders.  Encouraged to hydrate well with fluids.  Patient also encouraged to take all medication as prescribed.  If any worsening shortness of breath that does not improve with albuterol inhaler develops return for evaluation.  Chest x-ray  unremarkable today.  Patient verbalized understanding and agreement with plan today. Final Clinical Impressions(s) /  UC Diagnoses   Final diagnoses:  Acute bronchitis, unspecified organism   Discharge Instructions   None    ED Prescriptions     Medication Sig Dispense Auth. Provider   albuterol (VENTOLIN HFA) 108 (90 Base) MCG/ACT inhaler Inhale 2 puffs into the lungs every 6 (six) hours as needed for wheezing or shortness of breath. 8 g Bing Neighbors, NP   predniSONE (DELTASONE) 20 MG tablet Take 2 tablets (40 mg total) by mouth daily with breakfast. 10 tablet Bing Neighbors, NP   promethazine-dextromethorphan (PROMETHAZINE-DM) 6.25-15 MG/5ML syrup Take 5 mLs by mouth 4 (four) times daily as needed for cough. 240 mL Bing Neighbors, NP      PDMP not reviewed this encounter.   Bing Neighbors, NP 03/04/23 1120

## 2023-03-03 NOTE — ED Triage Notes (Signed)
Pt c/o cough that has been ongoing for about 10 days. Reports been dry cough for past 4 days. Taking Dayquil and Nyquil without relief.
